# Patient Record
Sex: Male | Born: 1990 | Race: White | Hispanic: No | Marital: Single | Smoking: Current every day smoker
Health system: Southern US, Community
[De-identification: ages and names within clinical notes are randomized; demographics above are authoritative.]

## PROBLEM LIST (undated history)

## (undated) DIAGNOSIS — F32A Depression, unspecified: Secondary | ICD-10-CM

## (undated) DIAGNOSIS — F419 Anxiety disorder, unspecified: Secondary | ICD-10-CM

## (undated) DIAGNOSIS — J45909 Unspecified asthma, uncomplicated: Secondary | ICD-10-CM

## (undated) DIAGNOSIS — F329 Major depressive disorder, single episode, unspecified: Secondary | ICD-10-CM

---
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
## 2013-04-09 ENCOUNTER — Emergency Department: Payer: Self-pay | Admitting: Emergency Medicine

## 2013-08-08 IMAGING — CT CT ABD-PELV W/O CM
1 of 2 series · 15 of 32 positions shown, 19 images · non-contrast
Comparison: none

REASON FOR EXAM: (1) R FLANK PAIN,HEMATURIA; (2) R FLANK PAIN,HEMATURIA
COMMENTS:   LMP: (Male)

[Series 2: stone · axial · 0.76mm/px · z∈[-526,-76]mm · 15 of 164 slices shown, 19 images]
[im 7/164  soft-tissue]
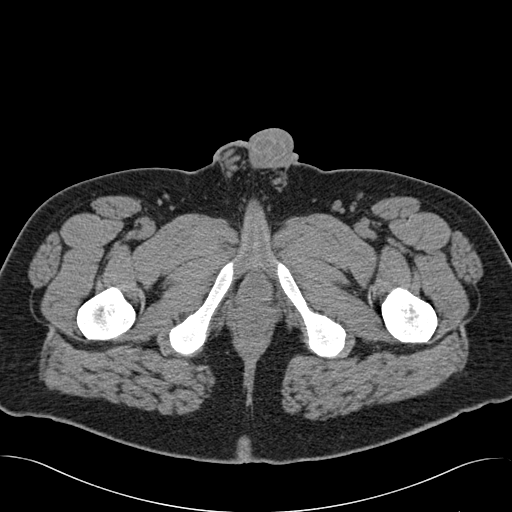
[im 7/164  bone]
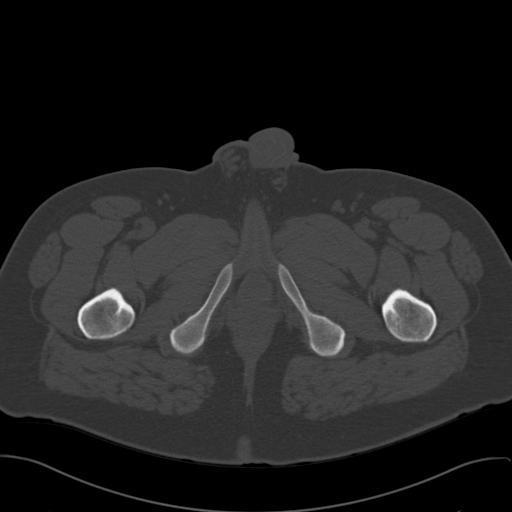
[im 19/164  soft-tissue]
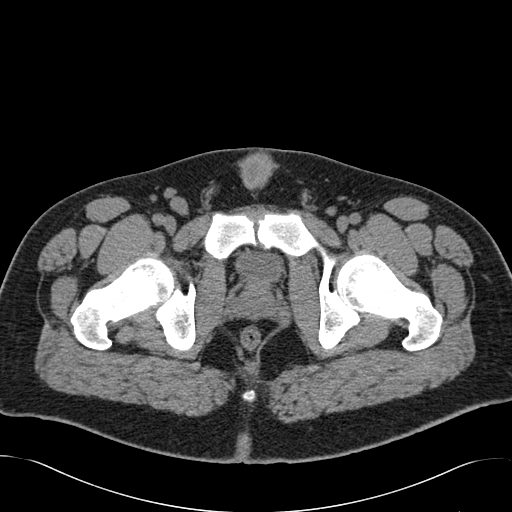
[im 32/164  soft-tissue]
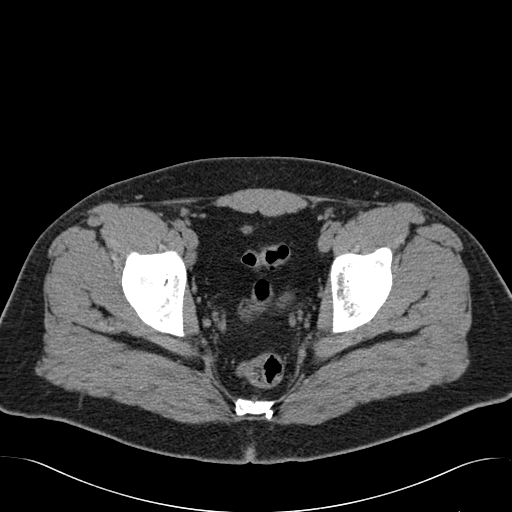
[im 44/164  soft-tissue]
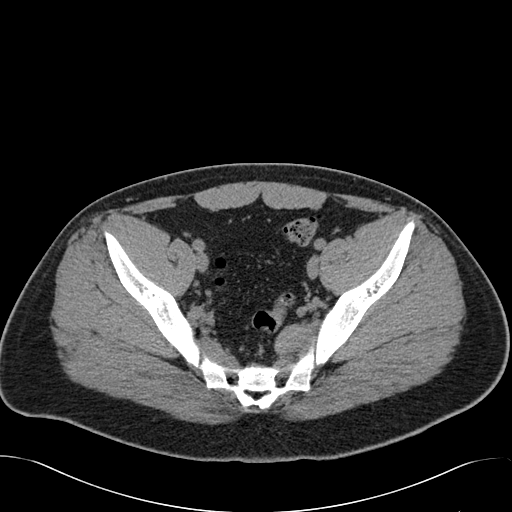
[im 57/164  soft-tissue]
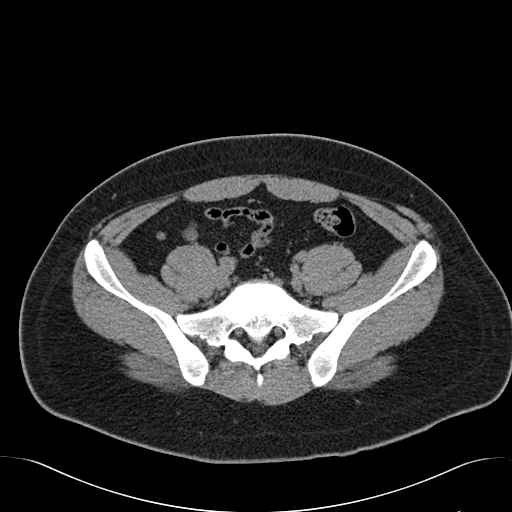
[im 69/164  soft-tissue]
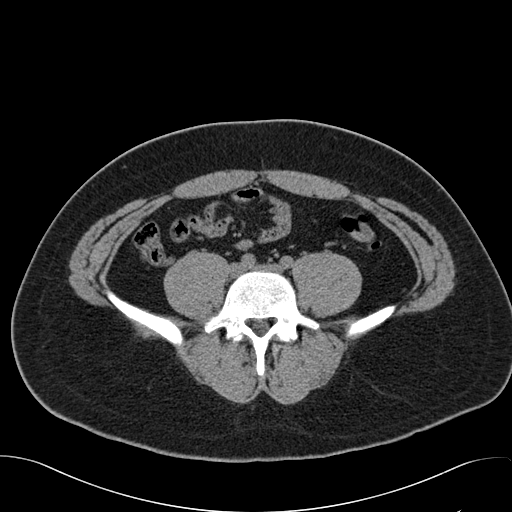
[im 82/164  soft-tissue]
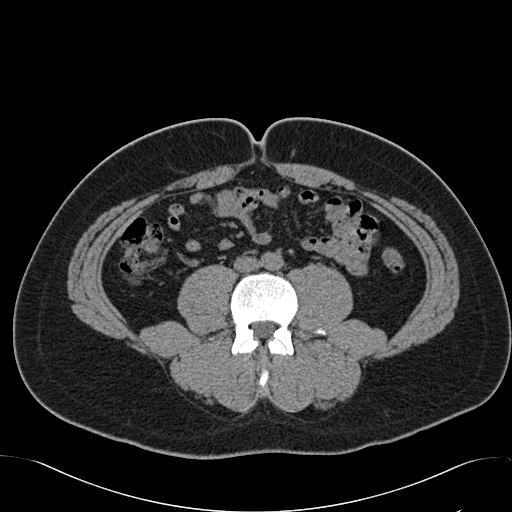
[im 95/164  soft-tissue]
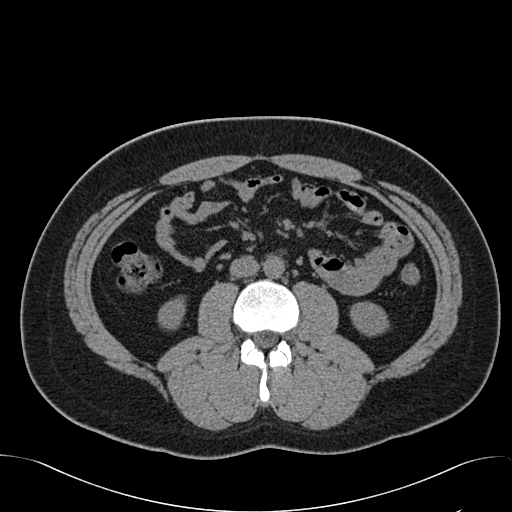
[im 107/164  soft-tissue]
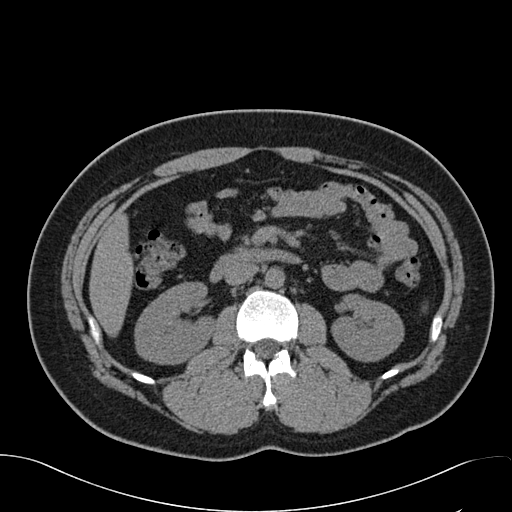
[im 107/164  bone]
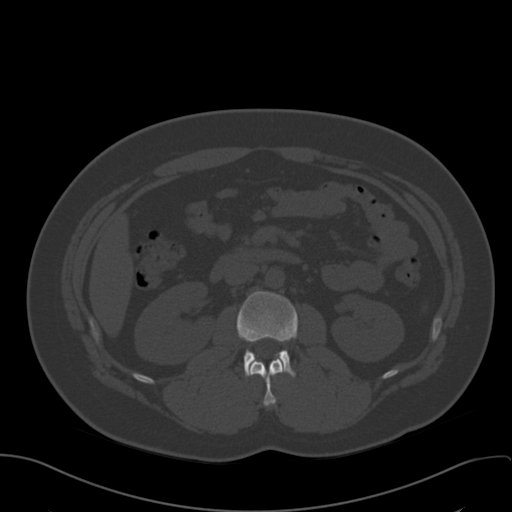
[im 120/164  soft-tissue]
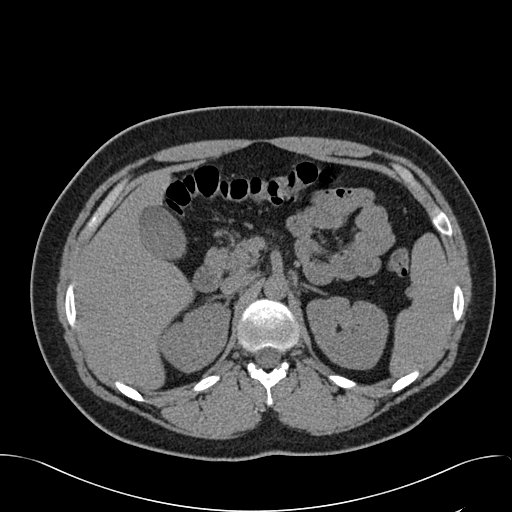
[im 132/164  soft-tissue]
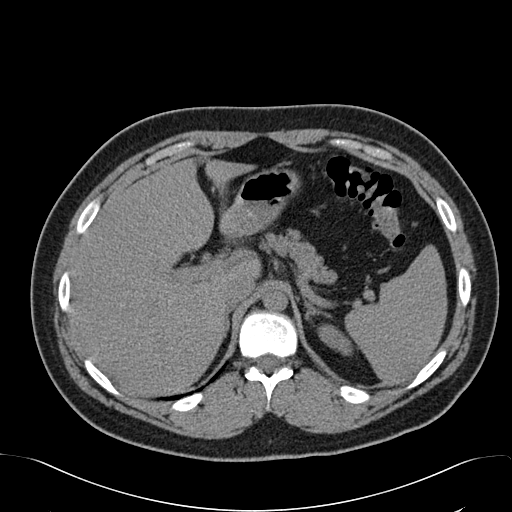
[im 138/164  lung]
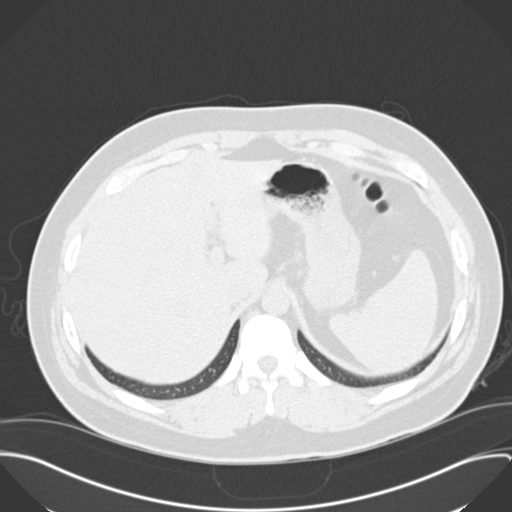
[im 145/164  soft-tissue]
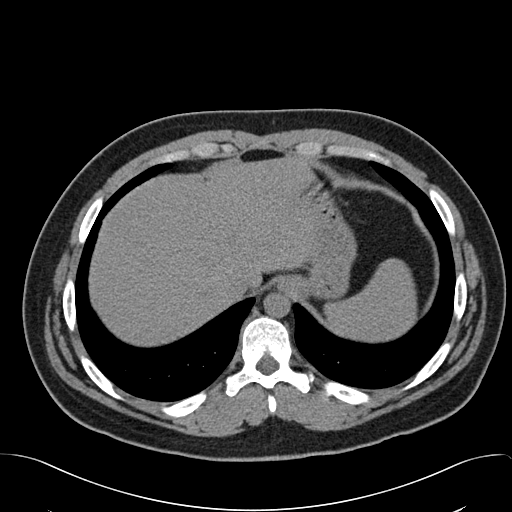
[im 145/164  lung]
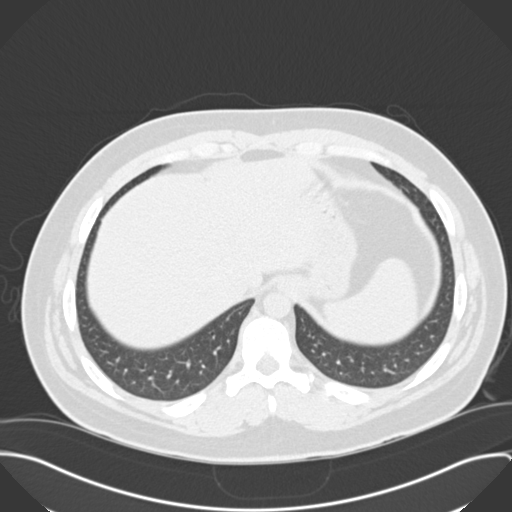
[im 151/164  lung]
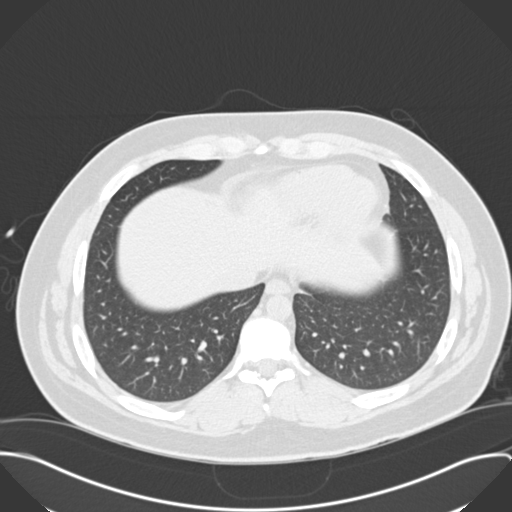
[im 157/164  soft-tissue]
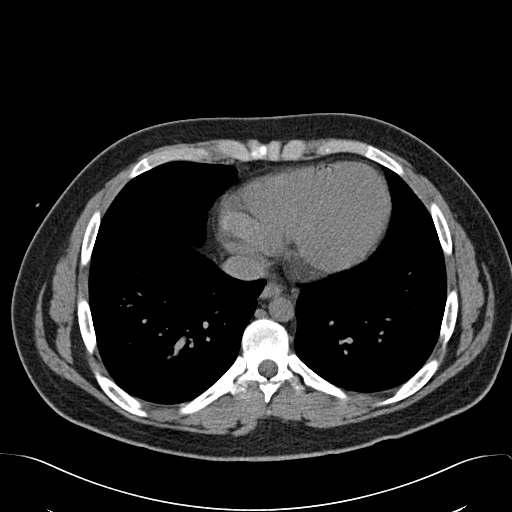
[im 157/164  lung]
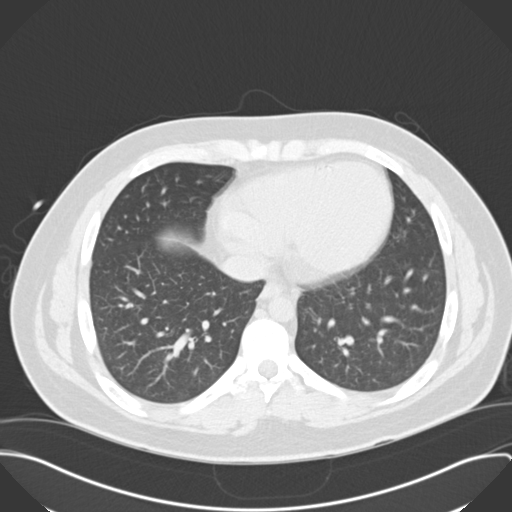

[15 of 32 positions shown; findings below may reference images not displayed]

PROCEDURE:     CT  - CT ABDOMEN AND PELVIS W[DATE]  [DATE]

RESULT:     Axial noncontrast CT scanning was performed through the abdomen
and pelvis with reconstructions at 3 mm intervals and slice thicknesses.
Review of multiplanar reconstructed images was performed separately on the
VIA monitor.

The kidneys exhibit normal contour. No calcified stones are identified and
there is no evidence of obstruction or perinephric inflammatory change.
Along the course of the ureters I see no evidence of stones or hydroureter.
The partially distended urinary bladder is normal in appearance. I see no
bladder or urethral stones.

The unopacified loops of small and large bowel exhibit no evidence of ileus
nor obstruction. A normal-appearing appendix is demonstrated on images 96
through 113. The periappendiceal soft tissues appear normal.

The liver, gallbladder, pancreas, spleen, nondistended stomach, adrenal
glands, and periaortic and pericaval regions are normal in appearance. I see
no inguinal nor umbilical hernia. The lumbar vertebral bodies are preserved
in height. The bony pelvis exhibits no acute abnormality. The psoas muscles
are normal in density and symmetric in size. The caliber of the abdominal
aorta is normal. I see no periaortic nor pericaval lymphadenopathy.
IMPRESSION: 1. I do not see evidence of urinary tract stones nor urinary tract
obstruction. No objective evidence of inflammatory changes of the urinary
tract are seen but correlation with clinical and laboratory values is needed
to exclude urinary tract infection.
2. There is no evidence of acute hepatobiliary abnormality nor acute bowel
abnormality. A normal-appearing appendix is demonstrated.
3. I do not see acute abnormality elsewhere within the abdomen or pelvis.

## 2015-09-22 IMAGING — CR LEFT WRIST - COMPLETE 3+ VIEW
1 series · 4 of 4 positions shown · non-contrast
Comparison: No priors.

CLINICAL DATA: Pain in the wrist.  Difficulty closing the hand.

EXAM:
LEFT WRIST - COMPLETE 3+ VIEW

[Series 1: pa · 0.17mm/px · 4 of 4 slices shown]
[im 1/4]
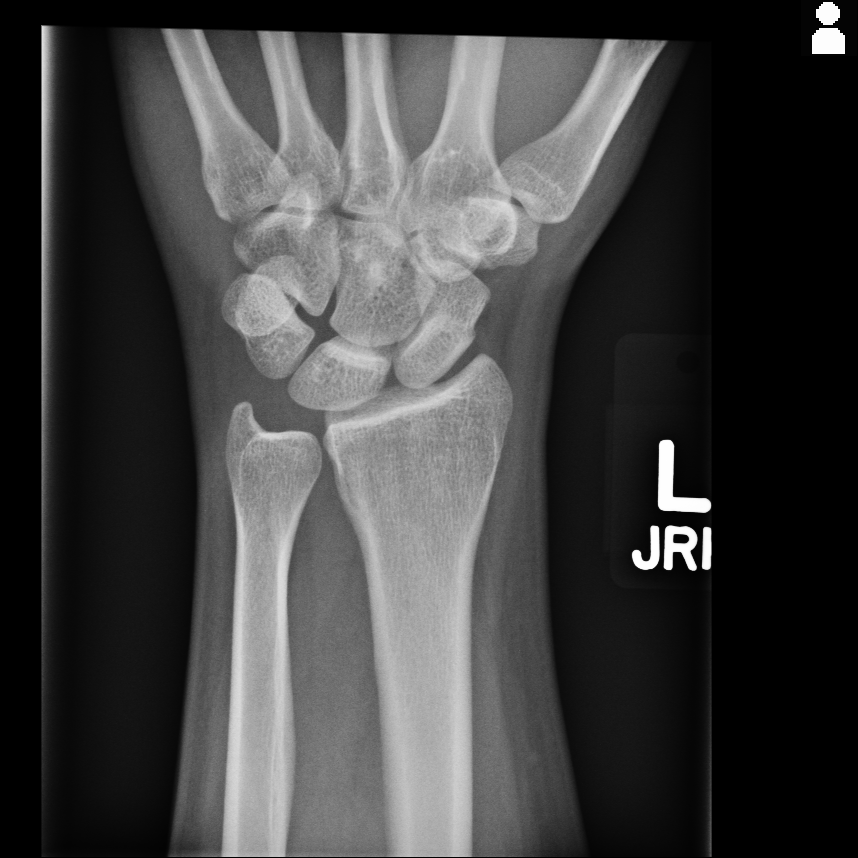
[im 2/4]
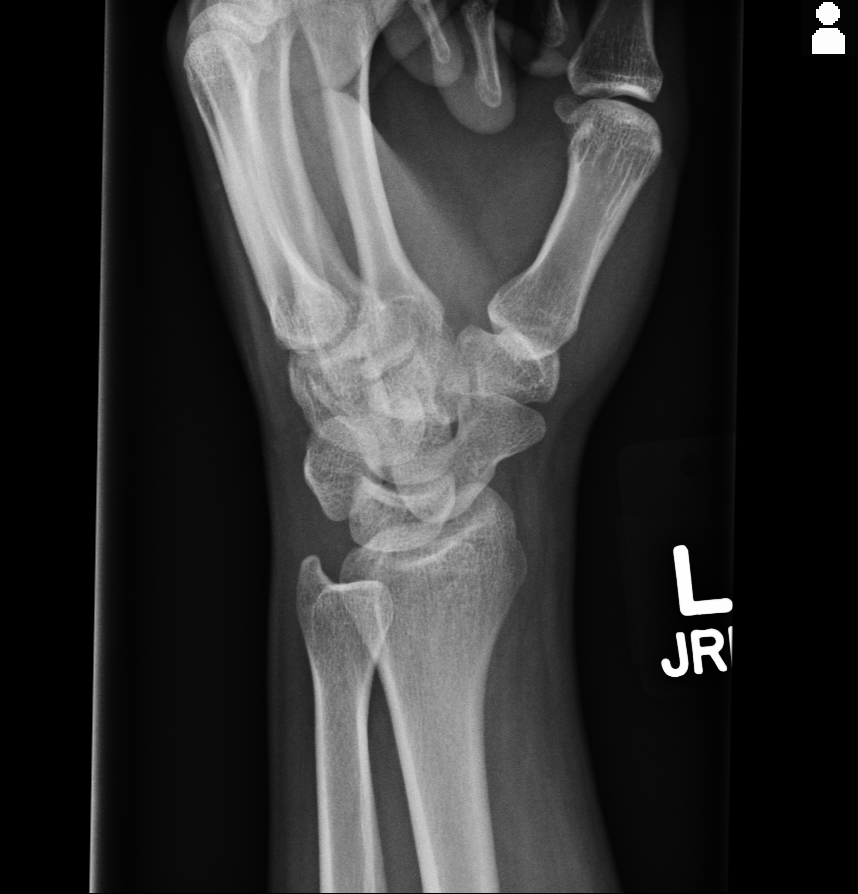
[im 3/4]
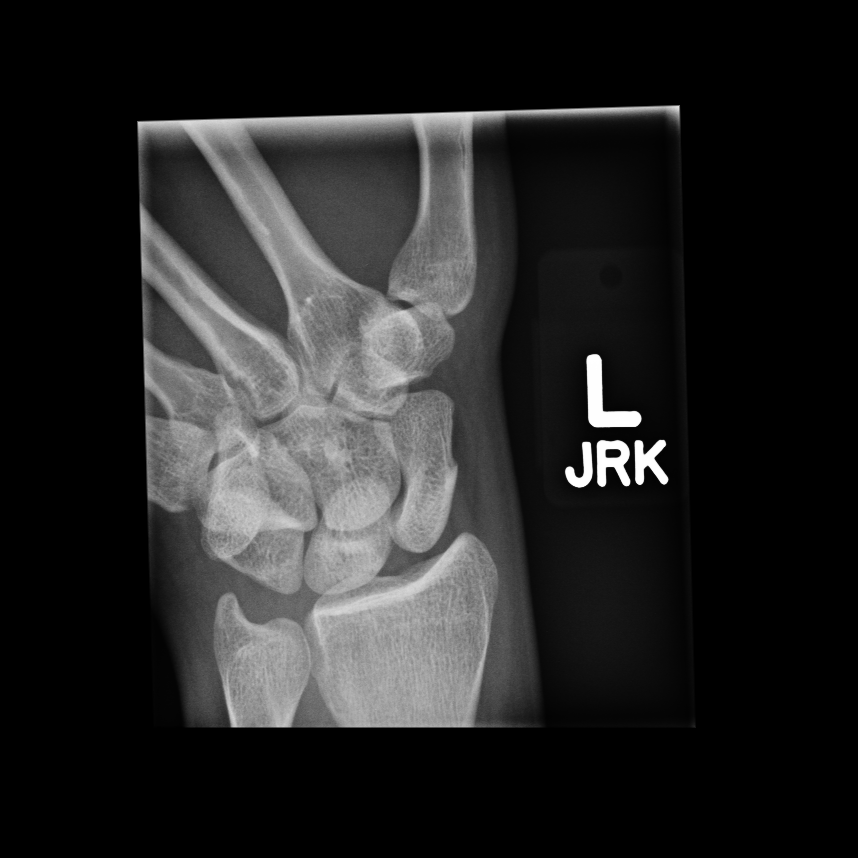
[im 4/4]
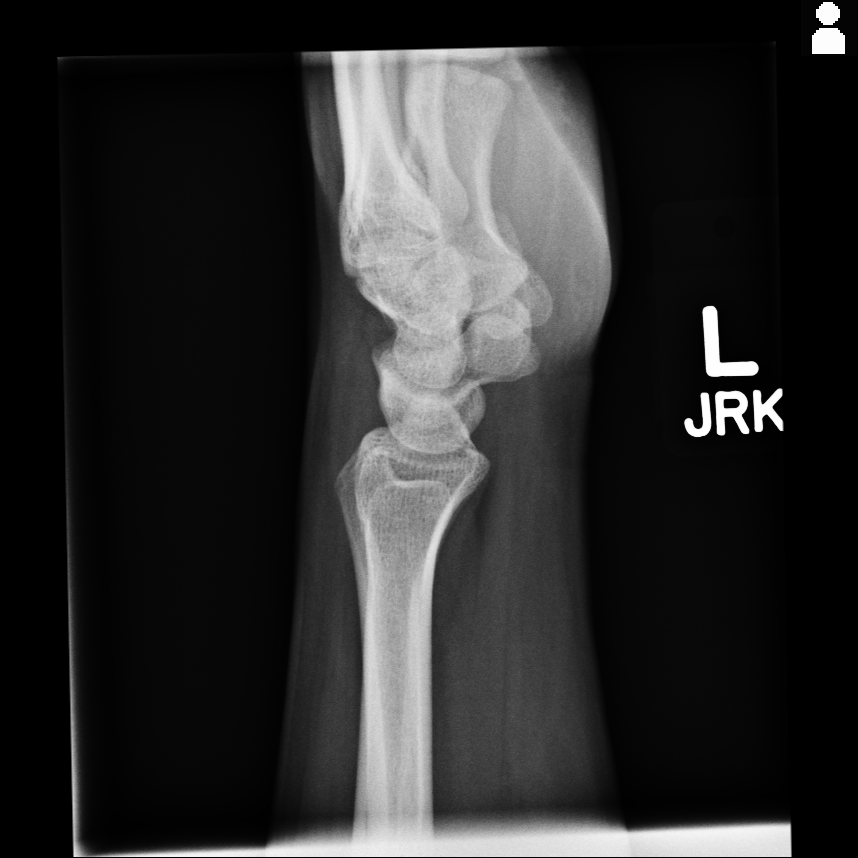

[4 of 4 positions shown; findings below may reference images not displayed]

FINDINGS: Multiple views of the left wrist demonstrate no acute displaced
fracture, subluxation, dislocation, or soft tissue abnormality.
IMPRESSION: No acute radiographic abnormality of the left wrist.

## 2016-01-17 ENCOUNTER — Emergency Department
Admission: EM | Admit: 2016-01-17 | Discharge: 2016-01-17 | Disposition: A | Discharge status: Home / Self Care | Payer: Self-pay | Attending: Emergency Medicine | Admitting: Emergency Medicine

## 2016-01-17 DIAGNOSIS — R112 Nausea with vomiting, unspecified: Secondary | ICD-10-CM | POA: Insufficient documentation

## 2016-01-17 DIAGNOSIS — R197 Diarrhea, unspecified: Secondary | ICD-10-CM | POA: Insufficient documentation

## 2016-01-17 LAB — URINALYSIS, COMPLETE (UACMP) WITH MICROSCOPIC
BACTERIA UA: NONE SEEN
BILIRUBIN URINE: NEGATIVE
Glucose, UA: NEGATIVE mg/dL
Hgb urine dipstick: NEGATIVE
KETONES UR: NEGATIVE mg/dL
LEUKOCYTES UA: NEGATIVE
Nitrite: NEGATIVE
PROTEIN: NEGATIVE mg/dL
Specific Gravity, Urine: 1.026 (ref 1.005–1.030)
WBC UA: NONE SEEN WBC/hpf (ref 0–5)
pH: 5 (ref 5.0–8.0)

## 2016-01-17 LAB — COMPREHENSIVE METABOLIC PANEL
ALT: 90 U/L — AB (ref 17–63)
AST: 49 U/L — AB (ref 15–41)
Albumin: 5.1 g/dL — ABNORMAL HIGH (ref 3.5–5.0)
Alkaline Phosphatase: 80 U/L (ref 38–126)
Anion gap: 7 (ref 5–15)
BUN: 11 mg/dL (ref 6–20)
CHLORIDE: 101 mmol/L (ref 101–111)
CO2: 30 mmol/L (ref 22–32)
Calcium: 10 mg/dL (ref 8.9–10.3)
Creatinine, Ser: 0.96 mg/dL (ref 0.61–1.24)
GFR calc non Af Amer: >60 mL/min (ref >60)
Glucose, Bld: 120 mg/dL — ABNORMAL HIGH (ref 65–99)
POTASSIUM: 4.2 mmol/L (ref 3.5–5.1)
SODIUM: 138 mmol/L (ref 135–145)
Total Bilirubin: 0.9 mg/dL (ref 0.3–1.2)
Total Protein: 8.7 g/dL — ABNORMAL HIGH (ref 6.5–8.1)

## 2016-01-17 LAB — CBC
HEMATOCRIT: 52.3 % — AB (ref 40.0–52.0)
Hemoglobin: 18.2 g/dL — ABNORMAL HIGH (ref 13.0–18.0)
MCH: 31.6 pg (ref 26.0–34.0)
MCHC: 34.8 g/dL (ref 32.0–36.0)
MCV: 90.9 fL (ref 80.0–100.0)
Platelets: 264 10*3/uL (ref 150–440)
RBC: 5.75 MIL/uL (ref 4.40–5.90)
RDW: 13.6 % (ref 11.5–14.5)
WBC: 16.2 10*3/uL — ABNORMAL HIGH (ref 3.8–10.6)

## 2016-01-17 LAB — LIPASE, BLOOD: LIPASE: 18 U/L (ref 11–51)

## 2016-01-17 MED ORDER — ONDANSETRON 4 MG PO TBDP
4.0000 mg | ORAL_TABLET | Freq: Once | ORAL | Status: AC | PRN
  Administered 2016-01-17: 4 mg via ORAL

## 2016-01-17 MED ORDER — SODIUM CHLORIDE 0.9 % IV BOLUS (SEPSIS)
1000.0000 mL | Freq: Once | INTRAVENOUS | Status: AC
  Administered 2016-01-17: 1000 mL via INTRAVENOUS

## 2016-01-17 MED ORDER — ONDANSETRON HCL 4 MG PO TABS: 4.0000 mg | ORAL_TABLET | Freq: Three times a day (TID) | ORAL | 0 refills | Status: DC | PRN

## 2016-01-17 MED ORDER — ONDANSETRON 4 MG PO TBDP
ORAL_TABLET | ORAL | Status: AC
  Filled 2016-01-17: qty 1

## 2016-01-17 MED ORDER — ONDANSETRON HCL 4 MG/2ML IJ SOLN
4.0000 mg | Freq: Once | INTRAMUSCULAR | Status: AC
  Administered 2016-01-17: 4 mg via INTRAVENOUS
  Filled 2016-01-17: qty 2

## 2016-01-17 NOTE — Discharge Instructions (Signed)
He was given IV fluids and as we discussed I suspect you have in intestinal virus.  Return to the emergency department for any worsening symptoms including bloody stool, black or bloody vomiting, dizziness or passing out, new or worsening abdominal pain, fever, altered mental status, or any other symptoms concerning to you.

## 2016-01-17 NOTE — ED Notes (Signed)
FIRST NURSE NOTE: Since 3am pt has been having vomiting and diarrhea as well as congestion and sore throat.  Unable to keep any food down.

## 2016-01-17 NOTE — ED Triage Notes (Signed)
Pt reports that he has been congested and states that last night he started with diarrhea and vomiting, last episode of vomiting was an hour ago, states no one here with him

## 2016-01-17 NOTE — ED Provider Notes (Signed)
[facility] Emergency Department Provider Note ____________________________________________   I have reviewed the triage vital signs and the triage nursing note.  HISTORY  Chief Complaint Emesis   Historian Patient  HPI Mario Coffey is a 25 y.o. male who states overnighthe's had innumerable number of watery emesis, nonbloody, and watery diarrhea. He has had some mild abdominal cramping. No fevers or chills. He is having trouble with eating getting liquids down per history.  No significant travel history or sick contacts.  Symptoms are moderate overall.    No past medical history on file.  There are no active problems to display for this patient.   No past surgical history on file.  Prior to Admission medications   Medication Sig Start Date End Date Taking? Authorizing Provider  ondansetron (ZOFRAN) 4 MG tablet Take 1 tablet (4 mg total) by mouth every 8 (eight) hours as needed for nausea or vomiting. 01/17/16   Mario Rooksebecca Carnita Golob, MD    Allergies  Allergen Reactions  . Codeine Nausea And Vomiting    No family history on file.  Social History Social History  Substance Use Topics  . Smoking status: Not on file  . Smokeless tobacco: Not on file  . Alcohol use Not on file    Review of Systems  Constitutional: Negative for fever. Eyes: Negative for visual changes. ENT: Negative for sore throat. Cardiovascular: Negative for chest pain. Respiratory: Negative for shortness of breath. Gastrointestinal:As per history of present illness.. Genitourinary: Negative for dysuria. Musculoskeletal: Negative for back pain. Skin: Negative for rash. Neurological: Negative for headache. 10 point Review of Systems otherwise negative ____________________________________________   PHYSICAL EXAM:  VITAL SIGNS: ED Triage Vitals [01/17/16 0939]  Enc Vitals Group     BP 135/80     Pulse Rate (!) 108     Resp 18     Temp 98.6 F (37 C)     Temp Source  Oral     SpO2 98 %     Weight 220 lb (99.8 kg)     Height 5\' 11"  (1.803 m)     Head Circumference      Peak Flow      Pain Score 6     Pain Loc      Pain Edu?      Excl. in GC?      Constitutional: Alert and oriented. Well appearing and in no distress. HEENT   Head: Normocephalic and atraumatic.      Eyes: Conjunctivae are normal. PERRL. Normal extraocular movements.      Ears:         Nose: No congestion/rhinnorhea.   Mouth/Throat: Mucous membranes are Moderately dry.   Neck: No stridor. Cardiovascular/Chest: Normal rate, regular rhythm.  No murmurs, rubs, or gallops. Respiratory: Normal respiratory effort without tachypnea nor retractions. Breath sounds are clear and equal bilaterally. No wheezes/rales/rhonchi. Gastrointestinal: Soft. No distention, no guarding, no rebound. Very mild tenderness diffusely without any focal abdominal pain.   Genitourinary/rectal:  Deferred Musculoskeletal: Nontender with normal range of motion in all extremities. No joint effusions.  No lower extremity tenderness.  No edema. Neurologic:  Normal speech and language. No gross or focal neurologic deficits are appreciated. Skin:  Skin is warm, dry and intact. No rash noted. Psychiatric: Mood and affect are normal. Speech and behavior are normal. Patient exhibits appropriate insight and judgment.   ____________________________________________  LABS (pertinent positives/negatives)  Labs Reviewed  COMPREHENSIVE METABOLIC PANEL - Abnormal; Notable for the following:  Result Value   Glucose, Bld 120 (*)    Total Protein 8.7 (*)    Albumin 5.1 (*)    AST 49 (*)    ALT 90 (*)    All other components within normal limits  CBC - Abnormal; Notable for the following:    WBC 16.2 (*)    Hemoglobin 18.2 (*)    HCT 52.3 (*)    All other components within normal limits  URINALYSIS, COMPLETE (UACMP) WITH MICROSCOPIC - Abnormal; Notable for the following:    Color, Urine YELLOW (*)     APPearance CLEAR (*)    Squamous Epithelial / LPF 0-5 (*)    All other components within normal limits  LIPASE, BLOOD    ____________________________________________    EKG I, Mario Rooksebecca Carmelita Amparo, MD, the attending physician have personally viewed and interpreted all ECGs.  None ____________________________________________  RADIOLOGY All Xrays were viewed by me. Imaging interpreted by Radiologist.  None __________________________________________  PROCEDURES  Procedure(s) performed: None  Critical Care performed: None  ____________________________________________   ED COURSE / ASSESSMENT AND PLAN  Pertinent labs & imaging results that were available during my care of the patient were reviewed by me and considered in my medical decision making (see chart for details).   Mr. Mario Coffey is here with nausea vomiting diarrhea overnight. He did receive Zofran in triage and has sipped on some Powerade. He does look mildly dehydrated clinically and I did offer him continue to by mouth hydrate versus IV bolus and he would like to go ahead and get one pack IV normal saline bolus.  Laboratory studies are reassuring with no acute renal failure or significant electrolyte abnormalities.   After symptomatic treatment, I think he is okay for outpatient discharge.  Again abdomen is soft and nonfocal, not highly concerned about intra-abdominal surgical/medical emergency and at this point I discussed with patient and his significant other and injured decision making on not recommending are moving forward with CT imaging at this point in time.  CONSULTATIONS:   None   Patient / Family / Caregiver informed of clinical course, medical decision-making process, and agree with plan.   I discussed return precautions, follow-up instructions, and discharge instructions with patient and/or family.   ___________________________________________   FINAL CLINICAL IMPRESSION(S) / ED DIAGNOSES   Final  diagnoses:  Nausea vomiting and diarrhea              Note: This dictation was prepared with Dragon dictation. Any transcriptional errors that result from this process are unintentional    Mario Rooksebecca Ragan Reale, MD 01/17/16 1406

## 2016-05-19 ENCOUNTER — Emergency Department
Admission: EM | Admit: 2016-05-19 | Discharge: 2016-05-19 | Disposition: A | Discharge status: Home / Self Care | Payer: Self-pay | Attending: Emergency Medicine | Admitting: Emergency Medicine

## 2016-05-19 ENCOUNTER — Encounter: Payer: Self-pay | Admitting: Emergency Medicine

## 2016-05-19 ENCOUNTER — Emergency Department: Payer: Self-pay

## 2016-05-19 DIAGNOSIS — F17210 Nicotine dependence, cigarettes, uncomplicated: Secondary | ICD-10-CM | POA: Insufficient documentation

## 2016-05-19 DIAGNOSIS — K921 Melena: Secondary | ICD-10-CM | POA: Insufficient documentation

## 2016-05-19 DIAGNOSIS — R1030 Lower abdominal pain, unspecified: Secondary | ICD-10-CM | POA: Insufficient documentation

## 2016-05-19 DIAGNOSIS — J45909 Unspecified asthma, uncomplicated: Secondary | ICD-10-CM | POA: Insufficient documentation

## 2016-05-19 HISTORY — DX: Unspecified asthma, uncomplicated: J45.909

## 2016-05-19 LAB — COMPREHENSIVE METABOLIC PANEL
ALK PHOS: 73 U/L (ref 38–126)
ALT: 49 U/L (ref 17–63)
AST: 41 U/L (ref 15–41)
Albumin: 4.5 g/dL (ref 3.5–5.0)
Anion gap: 7 (ref 5–15)
BUN: 14 mg/dL (ref 6–20)
CHLORIDE: 102 mmol/L (ref 101–111)
CO2: 29 mmol/L (ref 22–32)
CREATININE: 0.94 mg/dL (ref 0.61–1.24)
Calcium: 9.4 mg/dL (ref 8.9–10.3)
GFR calc Af Amer: >60 mL/min (ref >60)
Glucose, Bld: 106 mg/dL — ABNORMAL HIGH (ref 65–99)
Potassium: 3.9 mmol/L (ref 3.5–5.1)
SODIUM: 138 mmol/L (ref 135–145)
Total Bilirubin: 1 mg/dL (ref 0.3–1.2)
Total Protein: 7.9 g/dL (ref 6.5–8.1)

## 2016-05-19 LAB — CBC
HCT: 48.4 % (ref 40.0–52.0)
Hemoglobin: 16.9 g/dL (ref 13.0–18.0)
MCH: 31.7 pg (ref 26.0–34.0)
MCHC: 34.9 g/dL (ref 32.0–36.0)
MCV: 90.8 fL (ref 80.0–100.0)
PLATELETS: 249 10*3/uL (ref 150–440)
RBC: 5.33 MIL/uL (ref 4.40–5.90)
RDW: 12.8 % (ref 11.5–14.5)
WBC: 5.5 10*3/uL (ref 3.8–10.6)

## 2016-05-19 LAB — LIPASE, BLOOD: LIPASE: 16 U/L (ref 11–51)

## 2016-05-19 NOTE — ED Triage Notes (Signed)
States yesterday noticed blood with stool, today noted dark blood in commode. Abdominal pain x 2 days. Denies fevers.

## 2016-05-19 NOTE — ED Provider Notes (Signed)
[facility] Emergency Department Provider Note ____________________________________________   I have reviewed the triage vital signs and the triage nursing note.  HISTORY  Chief Complaint Rectal Bleeding and Abdominal Pain   Historian Patient  HPI Mario Coffey is a 26 y.o. male presenting for evaluation after one episode of bright red blood per rectum yesterday and then this morning had a loose bowel movement with some darker red per rectum. He noticed as he went to wipe himself. Sounds like overall was not that large amount. He has had some lower abdominal pain or cramping for 2 days. He thought he might be constipated. Then again today he had more of a watery bowel movement.  Nothing seems to make it worse or better. Pain is mild. Bleeding sounds like overall as mild but slightly different than his had in the past which was just the bright red blood on the toilet paper as he went.  States his dad had a history of frequent bloody bowel movements. He states his grandfather had a history of colon cancer.  He does not have a primary care doctor and currently is uninsured.      Past Medical History:  Diagnosis Date  . Asthma     There are no active problems to display for this patient.   History reviewed. No pertinent surgical history.  Prior to Admission medications   Medication Sig Start Date End Date Taking? Authorizing Provider  ondansetron (ZOFRAN) 4 MG tablet Take 1 tablet (4 mg total) by mouth every 8 (eight) hours as needed for nausea or vomiting. 01/17/16   Governor Rooks, MD    Allergies  Allergen Reactions  . Codeine Nausea And Vomiting    No family history on file.  Social History Social History  Substance Use Topics  . Smoking status: Current Every Day Smoker    Packs/day: 1.00    Types: Cigarettes  . Smokeless tobacco: Not on file  . Alcohol use Not on file    Review of Systems  Constitutional: Negative for fever. Eyes:  Negative for visual changes. ENT: Negative for sore throat. Cardiovascular: Negative for chest pain. Respiratory: Negative for shortness of breath. Gastrointestinal: Negative for Nausea or vomiting. Genitourinary: Negative for dysuria. Musculoskeletal: Negative for back pain. Skin: Negative for rash. Neurological: Negative for headache. 10 point Review of Systems otherwise negative ____________________________________________   PHYSICAL EXAM:  VITAL SIGNS: ED Triage Vitals  Enc Vitals Group     BP 05/19/16 0816 134/80     Pulse Rate 05/19/16 0816 85     Resp 05/19/16 0816 18     Temp 05/19/16 0816 98.3 F (36.8 C)     Temp Source 05/19/16 0816 Oral     SpO2 05/19/16 0816 98 %     Weight 05/19/16 0817 240 lb (108.9 kg)     Height 05/19/16 0817  (1.803 m)     Head Circumference --      Peak Flow --      Pain Score 05/19/16 0816 8     Pain Loc --      Pain Edu? --      Excl. in GC? --      Constitutional: Alert and oriented. Well appearing and in no distress. HEENT   Head: Normocephalic and atraumatic.      Eyes: Conjunctivae are normal. PERRL. Normal extraocular movements.      [facility].   Mouth/Throat: Mucous membranes are  moist.   Neck: No stridor. Cardiovascular/Chest: Normal rate, regular rhythm.  No murmurs, rubs, or gallops. Respiratory: Normal respiratory effort without tachypnea nor retractions. Breath sounds are clear and equal bilaterally. No wheezes/rales/rhonchi. Gastrointestinal: Soft. No distention, no guarding, no rebound. Very mild tenderness in the lower abdomen, no focal McBurney's point tenderness.  Genitourinary/rectal: No external hemorrhoids. No stool in the rectal vault. Hemoccult testing negative. Nontender rectal exam. Musculoskeletal: Nontender with normal range of motion in all extremities. No joint effusions.  No lower extremity tenderness.  No edema. Neurologic:  Normal speech and language.  No gross or focal neurologic deficits are appreciated. Skin:  Skin is warm, dry and intact. No rash noted. Psychiatric: Mood and affect are normal. Speech and behavior are normal. Patient exhibits appropriate insight and judgment.   ____________________________________________  LABS (pertinent positives/negatives)  Labs Reviewed  COMPREHENSIVE METABOLIC PANEL - Abnormal; Notable for the following:       Result Value   Glucose, Bld 106 (*)    All other components within normal limits  LIPASE, BLOOD  CBC  URINALYSIS, COMPLETE (UACMP) WITH MICROSCOPIC    ____________________________________________    EKG I, Governor Rooks, MD, the attending physician have personally viewed and interpreted all ECGs.  None ____________________________________________  RADIOLOGY All Xrays were viewed by me. Imaging interpreted by Radiologist.  X-ray abdomen 2 view:  FINDINGS: Upright and supine views. Normal lung bases. No pneumoperitoneum. Non obstructed bowel gas pattern. Abdominal and pelvic visceral contours are normal. No osseous abnormality identified.  IMPRESSION: Negative. Normal bowel gas pattern and no free air. __________________________________________  PROCEDURES  Procedure(s) performed: None  Critical Care performed: None  ____________________________________________   ED COURSE / ASSESSMENT AND PLAN  Pertinent labs & imaging results that were available during my care of the patient were reviewed by me and considered in my medical decision making (see chart for details).   Mr. Spofford is here for evaluation of rectal bleeding which sounds like overall mild amount, but was enough to worry him. He does not have a primary care doctor.  He states that the abdominal wall soreness or crampiness he felt was likely due to constipation. This morning he had more watery bowel movement.  No epigastric pain. Symptoms did not seem consistent with upper GI bleeding. On rectal exam  Hemoccult negative now.  No hypotension or tachycardia. Hemoglobin is 16.  On exam he has no external hemorrhoids, but we discussed the internal hemorrhoids may still be the source of the intermittent bright red blood per rectum.  Ultimately, based on his abdominal exam, I did not recommend a CT imaging at this point time. I don't think he needs an emergency GI consult or emergency colonoscopy or hospitalization.  We did discuss though however that I do think he needs close primary care follow-up for ongoing monitoring, repeat hemoglobin, and referral to gastroenterology. He is being referred to [facility] clinic as well as the [facility] for primary care. I'm giving the office number for gastroenterology, however I have also discussed with him the [facility] clinics might offer a more financially tolerable option.    CONSULTATIONS:  None   Patient / Family / Caregiver informed of clinical course, medical decision-making process, and agree with plan.   I discussed return precautions, follow-up instructions, and discharge instructions with patient and/or family.  Discharge instructions:  As we discussed, although no certain cause was found, your exam and evaluation are overall reassuring in the emergency department today.  We decided against CT of the  abdomen due to radiation risk versus benefit seems less beneficial today. This may be considered in the future.  You do need a primary care doctor to coordinate ongoing follow-up and care. I'm also recommending that you see a gastroenterologist, either here or at [facility] clinics.  Return to the emergency department immediately for any worsening condition including any new or worsening bleeding, heavy bleeding, new or worsening abdominal pain, vomiting blood, dizziness or passing out, or any other symptoms concerning to you.  ___________________________________________   FINAL CLINICAL IMPRESSION(S) / ED DIAGNOSES   Final diagnoses:  Lower  abdominal pain  Hematochezia              Note: This dictation was prepared with Dragon dictation. Any transcriptional errors that result from this process are unintentional    Governor Rooks, MD 05/19/16 [phone]

## 2016-05-19 NOTE — Discharge Instructions (Signed)
As we discussed, although no certain cause was found, your exam and evaluation are overall reassuring in the emergency department today.  We decided against CT of the abdomen due to radiation risk versus benefit seems less beneficial today. This may be considered in the future.  You do need a primary care doctor to coordinate ongoing follow-up and care. I'm also recommending that you see a gastroenterologist, either here or at [facility] clinics.  Return to the emergency department immediately for any worsening condition including any new or worsening bleeding, heavy bleeding, new or worsening abdominal pain, vomiting blood, dizziness or passing out, or any other symptoms concerning to you.

## 2017-05-10 DIAGNOSIS — Y998 Other external cause status: Secondary | ICD-10-CM | POA: Insufficient documentation

## 2017-05-10 DIAGNOSIS — Y929 Unspecified place or not applicable: Secondary | ICD-10-CM | POA: Insufficient documentation

## 2017-05-10 DIAGNOSIS — Z5321 Procedure and treatment not carried out due to patient leaving prior to being seen by health care provider: Secondary | ICD-10-CM | POA: Insufficient documentation

## 2017-05-10 DIAGNOSIS — Y9389 Activity, other specified: Secondary | ICD-10-CM | POA: Insufficient documentation

## 2017-05-10 DIAGNOSIS — S61214A Laceration without foreign body of right ring finger without damage to nail, initial encounter: Secondary | ICD-10-CM | POA: Insufficient documentation

## 2017-05-10 DIAGNOSIS — W25XXXA Contact with sharp glass, initial encounter: Secondary | ICD-10-CM | POA: Insufficient documentation

## 2017-05-11 ENCOUNTER — Emergency Department (HOSPITAL_COMMUNITY)
Admission: EM | Admit: 2017-05-11 | Discharge: 2017-05-11 | Disposition: A | Discharge status: Home / Self Care | Payer: Self-pay | Attending: Emergency Medicine | Admitting: Emergency Medicine

## 2017-05-11 ENCOUNTER — Other Ambulatory Visit: Payer: Self-pay

## 2017-05-11 ENCOUNTER — Encounter (HOSPITAL_COMMUNITY): Payer: Self-pay | Admitting: Emergency Medicine

## 2017-05-11 NOTE — ED Notes (Signed)
Pt ambulated off unit leaving after triage but before being seen by EDP

## 2017-05-11 NOTE — ED Triage Notes (Signed)
Pt states he was "playing around and hit a single glass pane window", pt has laceration to Right ring finger knuckle, incident happened about an hour ago, laceration not bleeding at this time

## 2017-05-11 NOTE — ED Provider Notes (Signed)
Pt not in room at 2:00 AM   Mario Coffey, Mario Perlstein, MD 05/11/17 [phone]

## 2017-07-26 ENCOUNTER — Encounter: Payer: Self-pay | Admitting: *Deleted

## 2017-07-26 ENCOUNTER — Other Ambulatory Visit: Payer: Self-pay

## 2017-07-26 DIAGNOSIS — K602 Anal fissure, unspecified: Secondary | ICD-10-CM | POA: Insufficient documentation

## 2017-07-26 DIAGNOSIS — F17210 Nicotine dependence, cigarettes, uncomplicated: Secondary | ICD-10-CM | POA: Insufficient documentation

## 2017-07-26 DIAGNOSIS — K625 Hemorrhage of anus and rectum: Secondary | ICD-10-CM | POA: Insufficient documentation

## 2017-07-26 NOTE — ED Triage Notes (Signed)
Pt reports blood in stools for 1 day.  Pt reports black tarry stools x 5.  States etoh use.  No dizziness.  Pt alert.  Speech clear.

## 2017-07-27 ENCOUNTER — Emergency Department
Admission: EM | Admit: 2017-07-27 | Discharge: 2017-07-27 | Disposition: A | Discharge status: Home / Self Care | Payer: Self-pay | Attending: Student in an Organized Health Care Education/Training Program | Admitting: Student in an Organized Health Care Education/Training Program

## 2017-07-27 DIAGNOSIS — K602 Anal fissure, unspecified: Secondary | ICD-10-CM

## 2017-07-27 DIAGNOSIS — K625 Hemorrhage of anus and rectum: Secondary | ICD-10-CM

## 2017-07-27 LAB — CBC
HCT: 44.9 % (ref 40.0–52.0)
Hemoglobin: 16.2 g/dL (ref 13.0–18.0)
MCH: 33.2 pg (ref 26.0–34.0)
MCHC: 36.2 g/dL — ABNORMAL HIGH (ref 32.0–36.0)
MCV: 91.7 fL (ref 80.0–100.0)
PLATELETS: 234 10*3/uL (ref 150–440)
RBC: 4.89 MIL/uL (ref 4.40–5.90)
RDW: 13.5 % (ref 11.5–14.5)
WBC: 8.2 10*3/uL (ref 3.8–10.6)

## 2017-07-27 LAB — URINALYSIS, COMPLETE (UACMP) WITH MICROSCOPIC
Bacteria, UA: NONE SEEN
Bilirubin Urine: NEGATIVE
Glucose, UA: NEGATIVE mg/dL
Hgb urine dipstick: NEGATIVE
Ketones, ur: NEGATIVE mg/dL
LEUKOCYTES UA: NEGATIVE
NITRITE: NEGATIVE
Protein, ur: NEGATIVE mg/dL
SPECIFIC GRAVITY, URINE: 1.01 (ref 1.005–1.030)
WBC, UA: NONE SEEN WBC/hpf (ref 0–5)
pH: 7 (ref 5.0–8.0)

## 2017-07-27 LAB — COMPREHENSIVE METABOLIC PANEL
ALT: 63 U/L — AB (ref 0–44)
AST: 56 U/L — ABNORMAL HIGH (ref 15–41)
Albumin: 4.3 g/dL (ref 3.5–5.0)
Alkaline Phosphatase: 75 U/L (ref 38–126)
Anion gap: 11 (ref 5–15)
BUN: 11 mg/dL (ref 6–20)
CALCIUM: 9.3 mg/dL (ref 8.9–10.3)
CHLORIDE: 101 mmol/L (ref 98–111)
CO2: 26 mmol/L (ref 22–32)
CREATININE: 0.9 mg/dL (ref 0.61–1.24)
Glucose, Bld: 113 mg/dL — ABNORMAL HIGH (ref 70–99)
Potassium: 3.7 mmol/L (ref 3.5–5.1)
Sodium: 138 mmol/L (ref 135–145)
Total Bilirubin: 0.7 mg/dL (ref 0.3–1.2)
Total Protein: 7.7 g/dL (ref 6.5–8.1)

## 2017-07-27 MED ORDER — RANITIDINE HCL 150 MG PO TABS: 150.0000 mg | ORAL_TABLET | Freq: Two times a day (BID) | ORAL | 1 refills | Status: DC

## 2017-07-27 MED ORDER — POLYETHYLENE GLYCOL 3350 17 G PO PACK: 17.0000 g | PACK | Freq: Every day | ORAL | 0 refills | Status: DC

## 2017-07-27 MED ORDER — DIBUCAINE 1 % RE OINT: 1.0000 "application " | TOPICAL_OINTMENT | RECTAL | 0 refills | Status: DC | PRN

## 2017-07-27 NOTE — ED Provider Notes (Signed)
[facility] Emergency Department Provider Note    First MD Initiated Contact with Patient 07/27/17 [id number]     (approximate)  I have reviewed the triage vital signs and the nursing notes.   HISTORY  Chief Complaint Rectal Bleeding    HPI Mario Coffey is a 27 y.o. male's the ER with chief complaint of bright red blood per rectum x1 day.  States he had one episode similar this roughly 1 year ago.  States he wiped his bottom and had a large amount of stool on the tissue paper as well as blood surrounding the stool.  States he has had issues with constipation had pain with defecation.  Denies any nausea or vomiting.  States he does have a history of heartburn and frequently drinks alcohol.  Denies any history of liver disease.  States he has noted some dark stools as well.  Is not on any antiacid medication.  Is never seen a GI doctor.    Past Medical History:  Diagnosis Date  . Asthma    No family history on file. No past surgical history on file. There are no active problems to display for this patient.     Prior to Admission medications   Medication Sig Start Date End Date Taking? Authorizing Provider  dibucaine (NUPERCAINAL) 1 % OINT Place 1 application rectally as needed for hemorrhoids. 07/27/17   Willy Eddyobinson, Ravin Bendall, MD  polyethylene glycol [facility](MIRALAX / Ethelene HalGLYCOLAX) packet Take 17 g by mouth daily. Mix one tablespoon with 8oz of your favorite juice or water every day until you are having soft formed stools. Then start taking once daily if you didn't have a stool the day before. 07/27/17   Willy Eddyobinson, Joncarlos Atkison, MD  ranitidine (ZANTAC) 150 MG tablet Take 1 tablet (150 mg total) by mouth 2 (two) times daily. 07/27/17 07/27/18  Willy Eddyobinson, Harvard Zeiss, MD    Allergies Codeine    Social History Social History   Tobacco Use  . Smoking status: Current Every Day Smoker    Packs/day: 1.00    Years: 5.00    Pack years: 5.00    Types: Cigarettes  . Smokeless tobacco:  Never Used  Substance Use Topics  . Alcohol use: Yes    Alcohol/week: 1.2 oz    Types: 2 Cans of beer per week    Comment: daily  . Drug use: Not Currently    Review of Systems Patient denies headaches, rhinorrhea, blurry vision, numbness, shortness of breath, chest pain, edema, cough, abdominal pain, nausea, vomiting, diarrhea, dysuria, fevers, rashes or hallucinations unless otherwise stated above in HPI. ____________________________________________   PHYSICAL EXAM:  VITAL SIGNS: Vitals:   07/26/17 2355  BP: (!) 132/98  Pulse: 90  Resp: 18  Temp: 98.9 F (37.2 C)  SpO2: 98%    Constitutional: Alert and oriented.  Eyes: Conjunctivae are normal.  Head: Atraumatic. Nose: No congestion/rhinnorhea. Mouth/Throat: Mucous membranes are moist.   Neck: No stridor. Painless ROM.  Cardiovascular: Normal rate, regular rhythm. Grossly normal heart sounds.  Good peripheral circulation. Respiratory: Normal respiratory effort.  No retractions. Lungs CTAB. Gastrointestinal: Soft and nontender. No distention. No abdominal bruits. No CVA tenderness. Genitourinary: Hemostatic right posterior anal fissure tear.  No evidence of abscess or hemorrhoid. Musculoskeletal: No lower extremity tenderness nor edema.  No joint effusions. Neurologic:  Normal speech and language. No gross focal neurologic deficits are appreciated. No facial droop Skin:  Skin is warm, dry and intact. No rash noted. Psychiatric: Mood and affect are normal. Speech and  behavior are normal.  ____________________________________________   LABS (all labs ordered are listed, but only abnormal results are displayed)  Results for orders placed or performed during the hospital encounter of 07/27/17 (from the past 24 hour(s))  Comprehensive metabolic panel     Status: Abnormal   Collection Time: 07/26/17 11:57 PM  Result Value Ref Range   Sodium 138 135 - 145 mmol/L   Potassium 3.7 3.5 - 5.1 mmol/L   Chloride 101 98 - 111  mmol/L   CO2 26 22 - 32 mmol/L   Glucose, Bld 113 (H) 70 - 99 mg/dL   BUN 11 6 - 20 mg/dL   Creatinine, Ser 1.61 0.61 - 1.24 mg/dL   Calcium 9.3 8.9 - 09.6 mg/dL   Total Protein 7.7 6.5 - 8.1 g/dL   Albumin 4.3 3.5 - 5.0 g/dL   AST 56 (H) 15 - 41 U/L   ALT 63 (H) 0 - 44 U/L   Alkaline Phosphatase 75 38 - 126 U/L   Total Bilirubin 0.7 0.3 - 1.2 mg/dL   GFR calc non Af Amer >60 >60 mL/min   GFR calc Af Amer >60 >60 mL/min   Anion gap 11 5 - 15  CBC     Status: Abnormal   Collection Time: 07/26/17 11:57 PM  Result Value Ref Range   WBC 8.2 3.8 - 10.6 K/uL   RBC 4.89 4.40 - 5.90 MIL/uL   Hemoglobin 16.2 13.0 - 18.0 g/dL   HCT 04.5 40.9 - 81.1 %   MCV 91.7 80.0 - 100.0 fL   MCH 33.2 26.0 - 34.0 pg   MCHC 36.2 (H) 32.0 - 36.0 g/dL   RDW 91.4 78.2 - 95.6 %   Platelets 234 150 - 440 K/uL  Urinalysis, Complete w Microscopic     Status: Abnormal   Collection Time: 07/26/17 11:57 PM  Result Value Ref Range   Color, Urine STRAW (A) YELLOW   APPearance CLEAR (A) CLEAR   Specific Gravity, Urine 1.010 1.005 - 1.030   pH 7.0 5.0 - 8.0   Glucose, UA NEGATIVE NEGATIVE mg/dL   Hgb urine dipstick NEGATIVE NEGATIVE   Bilirubin Urine NEGATIVE NEGATIVE   Ketones, ur NEGATIVE NEGATIVE mg/dL   Protein, ur NEGATIVE NEGATIVE mg/dL   Nitrite NEGATIVE NEGATIVE   Leukocytes, UA NEGATIVE NEGATIVE   RBC / HPF 0-5 0 - 5 RBC/hpf   WBC, UA NONE SEEN 0 - 5 WBC/hpf   Bacteria, UA NONE SEEN NONE SEEN   Squamous Epithelial / LPF 0-5 0 - 5   ____________________________________________ ____________________________________________  RADIOLOGY   ____________________________________________   PROCEDURES  Procedure(s) performed:  Procedures    Critical Care performed: no ____________________________________________   INITIAL IMPRESSION / ASSESSMENT AND PLAN / ED COURSE  Pertinent labs & imaging results that were available during my care of the patient were reviewed by me and considered in my  medical decision making (see chart for details).   DDX: Fissure, hemorrhoid, diverticular bleed, upper GI bleed  Mario Coffey is a 27 y.o. who presents to the ED with symptoms as described above.  Patient well-appearing and in no acute distress.  He has no evidence of acute blood loss anemia.  He is low risk by Blatchford Glascow scale.  Presentation most clinically consistent with anal fissure.  Given his history of alcohol use and heartburn will start on an acid as well as stool softeners for treatment of anal fissure.  Will give referral to GI.      As  part of my medical decision making, I reviewed the following data within the electronic MEDICAL RECORD NUMBER Nursing notes reviewed and incorporated, Labs reviewed, notes from prior ED visits.  ____________________________________________   FINAL CLINICAL IMPRESSION(S) / ED DIAGNOSES  Final diagnoses:  Anal fissure  Rectal bleeding      NEW MEDICATIONS STARTED DURING THIS VISIT:  New Prescriptions   DIBUCAINE (NUPERCAINAL) 1 % OINT    Place 1 application rectally as needed for hemorrhoids.   POLYETHYLENE GLYCOL (MIRALAX / GLYCOLAX) PACKET    Take 17 g by mouth daily. Mix one tablespoon with 8oz of your favorite juice or water every day until you are having soft formed stools. Then start taking once daily if you didn't have a stool the day before.   RANITIDINE (ZANTAC) 150 MG TABLET    Take 1 tablet (150 mg total) by mouth 2 (two) times daily.     Note:  This document was prepared using Dragon voice recognition software and may include unintentional dictation errors.    Willy Eddy, MD 07/27/17 [phone]

## 2018-01-27 ENCOUNTER — Encounter: Payer: Self-pay | Admitting: Psychiatry

## 2018-01-27 ENCOUNTER — Emergency Department
Admission: EM | Admit: 2018-01-27 | Discharge: 2018-01-28 | Disposition: A | Discharge status: Other Acute Inpatient Hospital | Payer: Self-pay | Attending: Student in an Organized Health Care Education/Training Program | Admitting: Student in an Organized Health Care Education/Training Program

## 2018-01-27 ENCOUNTER — Other Ambulatory Visit: Payer: Self-pay

## 2018-01-27 DIAGNOSIS — F17200 Nicotine dependence, unspecified, uncomplicated: Secondary | ICD-10-CM | POA: Diagnosis present

## 2018-01-27 DIAGNOSIS — J45909 Unspecified asthma, uncomplicated: Secondary | ICD-10-CM | POA: Insufficient documentation

## 2018-01-27 DIAGNOSIS — F17210 Nicotine dependence, cigarettes, uncomplicated: Secondary | ICD-10-CM | POA: Insufficient documentation

## 2018-01-27 DIAGNOSIS — F329 Major depressive disorder, single episode, unspecified: Secondary | ICD-10-CM | POA: Insufficient documentation

## 2018-01-27 DIAGNOSIS — F322 Major depressive disorder, single episode, severe without psychotic features: Secondary | ICD-10-CM | POA: Diagnosis present

## 2018-01-27 DIAGNOSIS — F1020 Alcohol dependence, uncomplicated: Secondary | ICD-10-CM | POA: Diagnosis present

## 2018-01-27 DIAGNOSIS — R45851 Suicidal ideations: Secondary | ICD-10-CM

## 2018-01-27 DIAGNOSIS — F1010 Alcohol abuse, uncomplicated: Secondary | ICD-10-CM | POA: Insufficient documentation

## 2018-01-27 DIAGNOSIS — Z79899 Other long term (current) drug therapy: Secondary | ICD-10-CM | POA: Insufficient documentation

## 2018-01-27 DIAGNOSIS — S61519A Laceration without foreign body of unspecified wrist, initial encounter: Secondary | ICD-10-CM | POA: Diagnosis present

## 2018-01-27 LAB — COMPREHENSIVE METABOLIC PANEL
ALBUMIN: 4.7 g/dL (ref 3.5–5.0)
ALT: 68 U/L — ABNORMAL HIGH (ref 0–44)
AST: 39 U/L (ref 15–41)
Alkaline Phosphatase: 77 U/L (ref 38–126)
Anion gap: 11 (ref 5–15)
BILIRUBIN TOTAL: 0.5 mg/dL (ref 0.3–1.2)
BUN: 10 mg/dL (ref 6–20)
CALCIUM: 9 mg/dL (ref 8.9–10.3)
CO2: 21 mmol/L — AB (ref 22–32)
Chloride: 107 mmol/L (ref 98–111)
Creatinine, Ser: 0.79 mg/dL (ref 0.61–1.24)
GFR calc non Af Amer: >60 mL/min (ref >60)
GLUCOSE: 115 mg/dL — AB (ref 70–99)
POTASSIUM: 3.9 mmol/L (ref 3.5–5.1)
SODIUM: 139 mmol/L (ref 135–145)
TOTAL PROTEIN: 8.4 g/dL — AB (ref 6.5–8.1)

## 2018-01-27 LAB — ACETAMINOPHEN LEVEL: Acetaminophen (Tylenol), Serum: <10 ug/mL — ABNORMAL LOW (ref 10–30)

## 2018-01-27 LAB — CBC
HCT: 48.7 % (ref 39.0–52.0)
HEMOGLOBIN: 17 g/dL (ref 13.0–17.0)
MCH: 31.8 pg (ref 26.0–34.0)
MCHC: 34.9 g/dL (ref 30.0–36.0)
MCV: 91 fL (ref 80.0–100.0)
Platelets: 270 10*3/uL (ref 150–400)
RBC: 5.35 MIL/uL (ref 4.22–5.81)
RDW: 12.5 % (ref 11.5–15.5)
WBC: 8 10*3/uL (ref 4.0–10.5)
nRBC: 0 % (ref 0.0–0.2)

## 2018-01-27 LAB — URINE DRUG SCREEN, QUALITATIVE (ARMC ONLY)
Amphetamines, Ur Screen: NOT DETECTED
BARBITURATES, UR SCREEN: NOT DETECTED
BENZODIAZEPINE, UR SCRN: NOT DETECTED
CANNABINOID 50 NG, UR ~~LOC~~: NOT DETECTED
COCAINE METABOLITE, UR ~~LOC~~: NOT DETECTED
MDMA (Ecstasy)Ur Screen: NOT DETECTED
METHADONE SCREEN, URINE: NOT DETECTED
Opiate, Ur Screen: NOT DETECTED
Phencyclidine (PCP) Ur S: NOT DETECTED
TRICYCLIC, UR SCREEN: NOT DETECTED

## 2018-01-27 LAB — SALICYLATE LEVEL

## 2018-01-27 LAB — ETHANOL: Alcohol, Ethyl (B): 201 mg/dL — ABNORMAL HIGH (ref <10)

## 2018-01-27 NOTE — BH Assessment (Signed)
Patient is to be admitted to [facility] by Dr. Jennet Maduro.  Attending Physician will be Dr. Jennet Maduro.   Patient has been assigned to room 309, by [facility] Charge Nurse T'Yawn.   ER staff is aware of the admission:  Emilie,, ER Secretary    Dr. Roxan Hockey, ER MD   Amy T., Patient's Nurse   Judi Saa, Patient Access.
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
## 2018-01-27 NOTE — ED Notes (Signed)
BEHAVIORAL HEALTH ROUNDING Patient sleeping: No. Patient alert and oriented: yes Behavior appropriate: Yes.  ; If no, describe:  Nutrition and fluids offered: yes Toileting and hygiene offered: Yes  Sitter present: q15 minute observations and security monitoring Law enforcement present: Yes  ODS   Per TTS  Pt will be admitted to [facility]  No admission orders in computer yet  - we must await MD orders and bed assignment

## 2018-01-27 NOTE — BH Assessment (Signed)
Writer was unable to complete TTS at this time. Patient kept going to sleep while talking. While try again at a latter time.
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
## 2018-01-27 NOTE — ED Notes (Signed)
BEHAVIORAL HEALTH ROUNDING Patient sleeping: Yes.   Patient alert and oriented: eyes closed  Appears asleep Behavior appropriate: Yes.  ; If no, describe:  Nutrition and fluids offered: Yes  Toileting and hygiene offered: sleeping Sitter present: q 15 minute observations and security monitoring Law enforcement present: yes  ODS 

## 2018-01-27 NOTE — ED Notes (Signed)
BEHAVIORAL HEALTH ROUNDING Patient sleeping: No. Patient alert and oriented: yes Behavior appropriate: Yes.  ; If no, describe:  Nutrition and fluids offered: yes Toileting and hygiene offered: Yes  Sitter present: q15 minute observations and security  monitoring Law enforcement present: Yes  ODS  
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
## 2018-01-27 NOTE — ED Notes (Signed)

## 2018-01-27 NOTE — Consult Note (Signed)
  Patient meets [facility] for psychiatric admission.  Orders entered. Case discussed with TTS.

## 2018-01-27 NOTE — ED Notes (Signed)
Pt reports  "I have been up drinking tonight - I usually work third shift so when I am off last night - I drank a litlle bit  - I have never been in the hospital except 2 months ago when my baby was born here "  Lacerations noted to left arm - pt states  "Yeah that was a crazy accident"

## 2018-01-28 ENCOUNTER — Inpatient Hospital Stay
Admission: AD | Admit: 2018-01-28 | Discharge: 2018-01-30 | DRG: 885 | Disposition: A | Discharge status: Home / Self Care | Payer: No Typology Code available for payment source | Attending: Psychiatry | Admitting: Psychiatry

## 2018-01-28 ENCOUNTER — Other Ambulatory Visit: Payer: Self-pay

## 2018-01-28 DIAGNOSIS — F17210 Nicotine dependence, cigarettes, uncomplicated: Secondary | ICD-10-CM | POA: Diagnosis present

## 2018-01-28 DIAGNOSIS — F17200 Nicotine dependence, unspecified, uncomplicated: Secondary | ICD-10-CM | POA: Diagnosis present

## 2018-01-28 DIAGNOSIS — G479 Sleep disorder, unspecified: Secondary | ICD-10-CM | POA: Diagnosis present

## 2018-01-28 DIAGNOSIS — Y907 Blood alcohol level of 200-239 mg/100 ml: Secondary | ICD-10-CM | POA: Diagnosis present

## 2018-01-28 DIAGNOSIS — Z885 Allergy status to narcotic agent status: Secondary | ICD-10-CM

## 2018-01-28 DIAGNOSIS — F322 Major depressive disorder, single episode, severe without psychotic features: Principal | ICD-10-CM | POA: Diagnosis present

## 2018-01-28 DIAGNOSIS — F419 Anxiety disorder, unspecified: Secondary | ICD-10-CM | POA: Diagnosis present

## 2018-01-28 DIAGNOSIS — R45851 Suicidal ideations: Secondary | ICD-10-CM | POA: Diagnosis present

## 2018-01-28 DIAGNOSIS — X789XXA Intentional self-harm by unspecified sharp object, initial encounter: Secondary | ICD-10-CM | POA: Diagnosis present

## 2018-01-28 DIAGNOSIS — F10129 Alcohol abuse with intoxication, unspecified: Secondary | ICD-10-CM | POA: Diagnosis present

## 2018-01-28 DIAGNOSIS — J45909 Unspecified asthma, uncomplicated: Secondary | ICD-10-CM | POA: Diagnosis present

## 2018-01-28 DIAGNOSIS — S61519A Laceration without foreign body of unspecified wrist, initial encounter: Secondary | ICD-10-CM | POA: Diagnosis present

## 2018-01-28 DIAGNOSIS — F1020 Alcohol dependence, uncomplicated: Secondary | ICD-10-CM | POA: Diagnosis present

## 2018-01-28 MED ORDER — ACETAMINOPHEN 325 MG PO TABS: 650.0000 mg | ORAL_TABLET | Freq: Four times a day (QID) | ORAL | Status: DC | PRN

## 2018-01-28 MED ORDER — TRAZODONE HCL 100 MG PO TABS: 150.0000 mg | ORAL_TABLET | Freq: Every evening | ORAL | Status: DC | PRN

## 2018-01-28 MED ORDER — ALUM & MAG HYDROXIDE-SIMETH 200-200-20 MG/5ML PO SUSP: 30.0000 mL | ORAL | Status: DC | PRN

## 2018-01-28 MED ORDER — NICOTINE 21 MG/24HR TD PT24
21.0000 mg | MEDICATED_PATCH | Freq: Every day | TRANSDERMAL | Status: DC
  Administered 2018-01-28 – 2018-01-30 (×3): 21 mg via TRANSDERMAL
  Filled 2018-01-28 (×3): qty 1

## 2018-01-28 MED ORDER — CHLORDIAZEPOXIDE HCL 10 MG PO CAPS
10.0000 mg | ORAL_CAPSULE | Freq: Four times a day (QID) | ORAL | Status: DC
  Administered 2018-01-28 – 2018-01-29 (×6): 10 mg via ORAL
  Filled 2018-01-28 (×6): qty 1

## 2018-01-28 MED ORDER — SERTRALINE HCL 25 MG PO TABS
25.0000 mg | ORAL_TABLET | Freq: Every day | ORAL | Status: DC
  Administered 2018-01-28 – 2018-01-29 (×2): 25 mg via ORAL
  Filled 2018-01-28 (×2): qty 1

## 2018-01-28 MED ORDER — TRAZODONE HCL 100 MG PO TABS: 100.0000 mg | ORAL_TABLET | Freq: Every evening | ORAL | Status: DC | PRN

## 2018-01-28 MED ORDER — MAGNESIUM HYDROXIDE 400 MG/5ML PO SUSP: 30.0000 mL | Freq: Every day | ORAL | Status: DC | PRN

## 2018-01-28 MED ORDER — HYDROXYZINE HCL 50 MG PO TABS: 50.0000 mg | ORAL_TABLET | Freq: Three times a day (TID) | ORAL | Status: DC | PRN
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
## 2018-01-28 NOTE — ED Notes (Signed)
Preparing patient for transfer to BMU 

## 2018-01-28 NOTE — Plan of Care (Signed)
Patient present in the milieu with steady gait. Affect flat with blunted mood. Denies having any thoughts of wanting to harm himself or anyone else at this time. States, "I feel ok, I was just drunk as hell the other night. I just want to go home." Compliant with his treatment plan at this time. Milieu remains safe at this time with q 15 minute safety checks.
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
## 2018-01-28 NOTE — Plan of Care (Signed)
Visible in the milieu, cooperative and compliant with unit expectations. Visited with family member.

## 2018-01-28 NOTE — Tx Team (Signed)
Initial Treatment Plan 01/28/2018 4:28 AM [name] KVQ:[id number]    PATIENT STRESSORS: Financial difficulties Marital or family conflict Substance abuse   PATIENT STRENGTHS: Motivation for treatment/growth Supportive family/friends   PATIENT IDENTIFIED PROBLEMS: Substance abuse     Suicide ideations                  DISCHARGE CRITERIA:  Improved stabilization in mood, thinking, and/or behavior Motivation to continue treatment in a less acute level of care  PRELIMINARY DISCHARGE PLAN: Attend 12-step recovery group Participate in family therapy  PATIENT/FAMILY INVOLVEMENT: This treatment plan has been presented to and reviewed with the patient, Mario Coffey, .  The patient and family have been given the opportunity to ask questions and make suggestions.  Trula Ore, RN 01/28/2018, 4:28 AM

## 2018-01-28 NOTE — [facility] Group Notes (Signed)
LCSW Group Therapy Note  01/28/2018 1:15pm  Type of Therapy and Topic:  Group Therapy:  Fears and Unhealthy Coping Skills  Participation Level:  Active   Description of Group:  The focus of this group was to discuss some of the prevalent fears that patients experience, and to identify the commonalities among group members.  An exercise was used to initiate the discussion, followed by writing on the white board a group-generated list of unhealthy coping and healthy coping techniques to deal with each fear.    Therapeutic Goals: 1. Patient will identify and describe 3 fears they experience 2. Patient will identify one positive coping strategy for each fear they experience 3. Patient will respond empathically to peers statements regarding fears they experience  Summary of Patient Progress:  The patient reported he feels "alright." Pt. discussed some of  his prevalent fears that he has experienced and was able to identify the commonalities among group members.  Pt shared shared with each other unhealthy copings and healthy coping techniques to deal with their fears. Pt. was actively and appropriately engaged in the group. Patient was able to provide support and validation to other group members.    Therapeutic Modalities Cognitive Behavioral Therapy Motivational Interviewing  Johnnye Sima, LCSW 01/28/2018 12:23 PM

## 2018-01-29 DIAGNOSIS — F1020 Alcohol dependence, uncomplicated: Secondary | ICD-10-CM

## 2018-01-29 DIAGNOSIS — F322 Major depressive disorder, single episode, severe without psychotic features: Principal | ICD-10-CM

## 2018-01-29 MED ORDER — CHLORDIAZEPOXIDE HCL 10 MG PO CAPS
10.0000 mg | ORAL_CAPSULE | Freq: Three times a day (TID) | ORAL | Status: DC
  Administered 2018-01-29 – 2018-01-30 (×2): 10 mg via ORAL
  Filled 2018-01-29 (×2): qty 1

## 2018-01-29 MED ORDER — SERTRALINE HCL 50 MG PO TABS: 50.0000 mg | ORAL_TABLET | Freq: Every day | ORAL | 1 refills | Status: DC

## 2018-01-29 MED ORDER — SERTRALINE HCL 25 MG PO TABS
50.0000 mg | ORAL_TABLET | Freq: Every day | ORAL | Status: DC
  Administered 2018-01-30: 50 mg via ORAL
  Filled 2018-01-29: qty 2

## 2018-01-29 MED ORDER — TRAZODONE HCL 150 MG PO TABS: 150.0000 mg | ORAL_TABLET | Freq: Every evening | ORAL | 1 refills | Status: DC | PRN

## 2018-01-29 NOTE — Plan of Care (Signed)
Patient verbalizes understanding of the general information that's been provided to him and has not voiced any further questions/concerns to this Clinical research associate. Patient denies SI/HI/AVH as well as any signs/symptoms of depression and anxiety at this time. Patient has been present in the milieu, demonstrates self-control on the unit, and has been observed interacting well with staff and other members on the unit. Patient has the ability to identify the available resources that can assist him in meeting his health care needs. Patient has been in compliance with his prescribed therapeutic regimen and has been free from any health related complications. Patient's goal for today is to "work on being able to get out of here". Patient has been free from injury thus far and remains safe on the unit at this time.  Problem: Education: Goal: Knowledge of [facility] Education information/materials will improve Outcome: Progressing Goal: Emotional status will improve Outcome: Progressing Goal: Mental status will improve Outcome: Progressing Goal: Verbalization of understanding the information provided will improve Outcome: Progressing   Problem: Activity: Goal: Interest or engagement in activities will improve Outcome: Progressing Goal: Sleeping patterns will improve Outcome: Progressing   Problem: Coping: Goal: Ability to verbalize frustrations and anger appropriately will improve Outcome: Progressing Goal: Ability to demonstrate self-control will improve Outcome: Progressing   Problem: Health Behavior/Discharge Planning: Goal: Identification of resources available to assist in meeting health care needs will improve Outcome: Progressing Goal: Compliance with treatment plan for underlying cause of condition will improve Outcome: Progressing   Problem: Physical Regulation: Goal: Ability to maintain clinical measurements within normal limits will improve Outcome: Progressing   Problem:  Safety: Goal: Periods of time without injury will increase Outcome: Progressing   Problem: Education: Goal: Ability to make informed decisions regarding treatment will improve Outcome: Progressing   Problem: Coping: Goal: Coping ability will improve Outcome: Progressing   Problem: Health Behavior/Discharge Planning: Goal: Identification of resources available to assist in meeting health care needs will improve Outcome: Progressing   Problem: Medication: Goal: Compliance with prescribed medication regimen will improve Outcome: Progressing   Problem: Self-Concept: Goal: Ability to disclose and discuss suicidal ideas will improve Outcome: Progressing Goal: Will verbalize positive feelings about self Outcome: Progressing   Problem: Education: Goal: Knowledge of disease or condition will improve Outcome: Progressing Goal: Understanding of discharge needs will improve Outcome: Progressing   Problem: Health Behavior/Discharge Planning: Goal: Ability to identify changes in lifestyle to reduce recurrence of condition will improve Outcome: Progressing Goal: Identification of resources available to assist in meeting health care needs will improve Outcome: Progressing   Problem: Physical Regulation: Goal: Complications related to the disease process, condition or treatment will be avoided or minimized Outcome: Progressing   Problem: Safety: Goal: Ability to remain free from injury will improve Outcome: Progressing

## 2018-01-29 NOTE — Progress Notes (Signed)
[facility] MD Progress Note  01/29/2018 12:07 PM Mario Coffey  MRN:  [id number] Subjective:   I'm doing fine, when can I go home?" Pt  with alcohol use issues and depression with suicide attempt while intoxicated.  Pt reports feeling better, denies SI/HI, slept 5 hrs, taking meds, tolerating well.  Principal Problem: <principal problem not specified> Diagnosis: Active Problems:   Major depressive disorder, single episode, severe without psychosis (HCC)  Total Time spent with patient: 25 min  Past Psychiatric History: no new info  Past Medical History:  Past Medical History:  Diagnosis Date  . Asthma    History reviewed. No pertinent surgical history. Family History: History reviewed. No pertinent family history. Family Psychiatric  History: no new info Social History:  Social History   Substance and Sexual Activity  Alcohol Use Yes  . Alcohol/week: 2.0 standard drinks  . Types: 2 Cans of beer per week   Comment: daily     Social History   Substance and Sexual Activity  Drug Use Not Currently    Social History   Socioeconomic History  . Marital status: Single    Spouse name: Not on file  . Number of children: Not on file  . Years of education: Not on file  . Highest education level: Not on file  Occupational History  . Not on file  Social Needs  . Financial resource strain: Not on file  . Food insecurity:    Worry: Not on file    Inability: Not on file  . Transportation needs:    Medical: Not on file    Non-medical: Not on file  Tobacco Use  . Smoking status: Current Every Day Smoker    Packs/day: 1.00    Years: 5.00    Pack years: 5.00    Types: Cigarettes  . Smokeless tobacco: Never Used  Substance and Sexual Activity  . Alcohol use: Yes    Alcohol/week: 2.0 standard drinks    Types: 2 Cans of beer per week    Comment: daily  . Drug use: Not Currently  . Sexual activity: Not on file  Lifestyle  . Physical activity:    Days per week: Not on file     Minutes per session: Not on file  . Stress: Not on file  Relationships  . Social connections:    Talks on phone: Not on file    Gets together: Not on file    Attends religious service: Not on file    Active member of club or organization: Not on file    Attends meetings of clubs or organizations: Not on file    Relationship status: Not on file  Other Topics Concern  . Not on file  Social History Narrative  . Not on file   Additional Social History:                         Sleep: Poor  Appetite:  Fair  Current Medications: Current Facility-Administered Medications  Medication Dose Route Frequency Provider Last Rate Last Dose  . acetaminophen (TYLENOL) tablet 650 mg  650 mg Oral Q6H PRN Pucilowska, Jolanta B, MD      . alum & mag hydroxide-simeth (MAALOX/MYLANTA) 200-200-20 MG/5ML suspension 30 mL  30 mL Oral Q4H PRN Pucilowska, Jolanta B, MD      . chlordiazePOXIDE (LIBRIUM) capsule 10 mg  10 mg Oral QID Pucilowska, Jolanta B, MD   10 mg at 01/29/18 1203  . hydrOXYzine (ATARAX/VISTARIL)  tablet 50 mg  50 mg Oral TID PRN Pucilowska, Jolanta B, MD      . magnesium hydroxide (MILK OF MAGNESIA) suspension 30 mL  30 mL Oral Daily PRN Pucilowska, Jolanta B, MD      . nicotine (NICODERM CQ - dosed in mg/24 hours) patch 21 mg  21 mg Transdermal Q0600 Pucilowska, Jolanta B, MD   21 mg at 01/29/18 0631  . sertraline (ZOLOFT) tablet 25 mg  25 mg Oral Daily Beverly SessionsSubedi, Mattheu Brodersen, MD   25 mg at 01/29/18 [id number]  . traZODone (DESYREL) tablet 150 mg  150 mg Oral QHS PRN Beverly SessionsSubedi, Khayree Delellis, MD        Lab Results: No results found for this or any previous visit (from the past 48 hour(s)).  Blood Alcohol level:  Lab Results  Component Value Date   ETH 201 (H) 01/27/2018    Metabolic Disorder Labs: No results found for: HGBA1C, MPG No results found for: PROLACTIN No results found for: CHOL, TRIG, HDL, CHOLHDL, VLDL, LDLCALC  Physical Findings: AIMS: Facial and Oral Movements Muscles  of Facial Expression: None, normal Lips and Perioral Area: None, normal Jaw: None, normal Tongue: None, normal,Extremity Movements Upper (arms, wrists, hands, fingers): None, normal Lower (legs, knees, ankles, toes): None, normal, Trunk Movements Neck, shoulders, hips: None, normal, Overall Severity Severity of abnormal movements (highest score from questions above): None, normal Incapacitation due to abnormal movements: None, normal Patient's awareness of abnormal movements (rate only patient's report): No Awareness, Dental Status Current problems with teeth and/or dentures?: No Does patient usually wear dentures?: No  CIWA:  CIWA-Ar Total: 2 COWS:     Musculoskeletal: Strength & Muscle Tone: within normal limits Gait & Station: normal Patient leans:   Psychiatric Specialty Exam: Physical Exam  Nursing note and vitals reviewed.   ROS  Blood pressure 116/90, pulse 76, temperature 98.1 F (36.7 C), temperature source Oral, resp. rate 16, SpO2 98 %.There is no height or weight on file to calculate BMI.  General Appearance: Disheveled  Eye Contact:  Fair  Speech:  Slow  Volume:  Normal  Mood:  Anxious and Depressed  Affect:  Congruent  Thought Process:  Coherent  Orientation:  Full (Time, Place, and Person)  Thought Content:  Depressed,   Suicidal Thoughts:  Denies   Homicidal Thoughts:  No  Memory:  Negative  Judgement:  Impaired  Insight:  Shallow  Psychomotor Activity:  Decreased  Concentration:  fair  Recall:  Fair  Fund of Knowledge:  Good  Language:  Good  Akathisia:  No  Handed:    AIMS (if indicated):     Assets:  Communication Skills Physical Health  ADL's:  Intact  Cognition:  WNL  Sleep:  Number of Hours: 5       Treatment Plan Summary: Daily contact with patient to assess and evaluate symptoms and progress in treatment and Medication management. Pt presenting with alcohol use issues and depression with suicide attempt while intoxicated.  Plan-   Tapering librium  zoloft for depression, anxiety,  trazodone for sleep TSH ordered.  Group, and milieu tx.  Will address acute medical issues when needed.  IVC status, 15min check.    Beverly SessionsJagannath Kadie Balestrieri, MD 01/29/2018, 12:07 PM

## 2018-01-29 NOTE — [facility] Suicide Risk Assessment (Signed)
[facility] Discharge Suicide Risk Assessment   Principal Problem: Major depressive disorder, single episode, severe without psychosis (HCC) Discharge Diagnoses: Principal Problem:   Major depressive disorder, single episode, severe without psychosis (HCC) Active Problems:   Alcohol use disorder, moderate, dependence (HCC)   Tobacco use disorder   Self-inflicted laceration of wrist   Total Time spent with patient: 20 minutes  Musculoskeletal: Strength & Muscle Tone: within normal limits Gait & Station: normal Patient leans: N/A  Psychiatric Specialty Exam: Review of Systems  Neurological: Negative.   Psychiatric/Behavioral: Positive for substance abuse.  All other systems reviewed and are negative.   Blood pressure 108/69, pulse 64, temperature 97.9 F (36.6 C), temperature source Oral, resp. rate 16, SpO2 97 %.There is no height or weight on file to calculate BMI.  General Appearance: Casual  Eye Contact::  Good  Speech:  Clear and Coherent409  Volume:  Normal  Mood:  Euthymic  Affect:  Appropriate  Thought Process:  Goal Directed, Irrelevant and Descriptions of Associations: Intact  Orientation:  Full (Time, Place, and Person)  Thought Content:  WDL  Suicidal Thoughts:  No  Homicidal Thoughts:  No  Memory:  Immediate;   Fair Recent;   Fair Remote;   Fair  Judgement:  Impaired  Insight:  Shallow  Psychomotor Activity:  Normal  Concentration:  Fair  Recall:  [employer] of Knowledge:Fair  Language: Fair  Akathisia:  No  Handed:  Right  AIMS (if indicated):     Assets:  Communication Skills Desire for Improvement Housing Intimacy Physical Health Resilience Social Support  Sleep:  Number of Hours: 6.5  Cognition: WNL  ADL's:  Intact   Mental Status Per Nursing Assessment::   On Admission:  NA  Demographic Factors:  Male, Caucasian and Unemployed  Loss Factors: Decrease in vocational status and Financial problems/change in socioeconomic status  Historical  Factors: Impulsivity  Risk Reduction Factors:   Responsible for children under 37 years of age, Sense of responsibility to family, Living with another person, especially a relative and Positive social support  Continued Clinical Symptoms:  Alcohol/Substance Abuse/Dependencies  Cognitive Features That Contribute To Risk:  None    Suicide Risk:  Minimal: No identifiable suicidal ideation.  Patients presenting with no risk factors but with morbid ruminations; may be classified as minimal risk based on the severity of the depressive symptoms    Plan Of Care/Follow-up recommendations:  Activity:  as tolerated Diet:  regular Other:  kep follow up appointments  Kristine Linea, MD 01/30/2018, 8:53 AM

## 2018-01-29 NOTE — Progress Notes (Signed)
Patient visited with a family member. Was calm and cooperative, denying thoughts of self harm. Stayed in the milieu with peers and attended group. No visible sign of distress. Had a snack and received bedtime medications. Stayed in the dayroom late watching TV. Went to bed and slept for the rest of the night. Currently awake. No major concern throughout this shift. Safety precautions maintained.

## 2018-01-29 NOTE — Progress Notes (Signed)
D- Patient alert and oriented. Patient presents in a peasant mood on assessment stating that he slept pretty good last night and had no major complaints or concerns to voice to this Clinical research associate. Patient denies any depression/anxiety. Patient also denies SI, HI, AVH, and pain at this time. Patient's goal for today is "to work on how to deal with triggers and to go home".  A- Scheduled medications administered to patient, per MD orders. Support and encouragement provided.  Routine safety checks conducted every 15 minutes.  Patient informed to notify staff with problems or concerns.  R- No adverse drug reactions noted. Patient contracts for safety at this time. Patient compliant with medications and treatment plan. Patient receptive, calm, and cooperative. Patient interacts well with others on the unit.  Patient remains safe at this time.

## 2018-01-29 NOTE — [facility] Group Notes (Signed)
LCSW Group Therapy Note 01/29/2018 1:15pm  Type of Therapy and Topic: Group Therapy: Feelings Around Returning Home & Establishing a Supportive Framework and Supporting Oneself When Supports Not Available  Participation Level: Active  Description of Group:  Patients first processed thoughts and feelings about upcoming discharge. These included fears of upcoming changes, lack of change, new living environments, judgements and expectations from others and overall stigma of mental health issues. The group then discussed the definition of a supportive framework, what that looks and feels like, and how do to discern it from an unhealthy non-supportive network. The group identified different types of supports as well as what to do when your family/friends are less than helpful or unavailable  Therapeutic Goals  1. Patient will identify one healthy supportive network that they can use at discharge. 2. Patient will identify one factor of a supportive framework and how to tell it from an unhealthy network. 3. Patient able to identify one coping skill to use when they do not have positive supports from others. 4. Patient will demonstrate ability to communicate their needs through discussion and/or role plays.  Summary of Patient Progress:  The patient reported he feels "pretty good today." Pt engaged during group session. As patients processed their anxiety about discharge and described healthy supports patient shared he is ready to be discharge.  Patients identified at least one self-care tool they were willing to use after discharge.   Therapeutic Modalities Cognitive Behavioral Therapy Motivational Interviewing   Geri Hepler  CUEBAS-COLON, LCSW 01/29/2018 9:58 AM

## 2018-01-29 NOTE — BHH Counselor (Signed)
Adult Comprehensive Assessment  Patient ID: Mario Coffey, male   DOB: 01-31-1990, 28 y.o.   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 / Lack of housing: stable Physical health (include injuries & life threatening diseases): none reported Social relationships: "alright" Substance abuse: ETOH Bereavement / Loss: none reported  Living/Environment/Situation:  Living Arrangements: Spouse/significant other Who else lives in the home?: girlfriend and 4 children How long has patient lived in current situation?: 1 year What is atmosphere in current home: Loving  Family History:  Marital status: Long term relationship Long term relationship, how long?: 3 years Are you sexually active?: Yes What is your sexual orientation?: heterosexual Has your sexual activity been affected by drugs, alcohol, medication, or emotional stress?: no  Does patient have children?: Yes How many children?: 4 How is patient's relationship with their children?: pt reports he has two step-children and two biological children- he states he has a great relationship with all of them  Childhood History:  By whom was/is the patient raised?: Mother Description of patient's relationship with caregiver when they were a child: "alright" Patient's description of current relationship with people who raised him/her: "we don't talk" How were you disciplined when you got in trouble as a child/adolescent?: physical punishment Does patient have siblings?: Yes Number of Siblings:  3 Description of patient's current relationship with siblings: "I don't talk to some of them" Did patient suffer any verbal/emotional/physical/sexual abuse as a child?: No Did patient suffer from severe childhood neglect?: No Has patient ever been sexually abused/assaulted/raped as an adolescent or adult?: No Was the patient ever a victim of a crime or a disaster?: No Witnessed domestic violence?: No Has patient been effected by domestic violence as an adult?: No  Education:  Highest grade of school patient has completed: GED Currently a Consulting civil engineer?: No Learning disability?: No  Employment/Work Situation:   Employment situation: Unemployed Patient's job has been impacted by current illness: No What is the longest time patient has a held a job?: 3 MONTHS 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, environmental Did You Receive Any Psychiatric Treatment/Services While in Equities trader?: No Are There Guns or Other Weapons in Your Home?: No  Financial Resources:   Financial resources: Income from spouse Does patient have a representative payee or guardian?: No  Alcohol/Substance Abuse:   What has been your use of drugs/alcohol within the last 12 months?: ETOH -daily If attempted suicide, did drugs/alcohol play a role in this?: Yes Alcohol/Substance Abuse Treatment Hx: Denies past history Has alcohol/substance abuse ever caused legal problems?: Yes(DUI)  Social Support System:   Patient's Community Support System: [employer] Museum/gallery exhibitions officer System: girlfriend and children Type of faith/religion: n/a How does patient's faith help to cope with current illness?: n/a  Leisure/Recreation:   Leisure and Hobbies: cooking and reading  Strengths/Needs:   What is the patient's perception of their strengths?: "good at being a father" Patient states they can use these personal strengths during their treatment to contribute to their recovery: "I don't know" Patient states these  barriers may affect/interfere with their treatment: "I don't know" Patient states these barriers may affect their  return to the community: "might go to jail after discharge"  Discharge Plan:   Currently receiving community mental health services: No Patient states concerns and preferences for aftercare planning are: TBD with CSW Patient states they will know when they are safe and ready for discharge when: "I am ready now" Does patient have access to transportation?: Yes(girlfriend) Does patient have financial barriers related to discharge medications?: No Will patient be returning to same living situation after discharge?: Yes  Summary/Recommendations:   Summary and Recommendations (to be completed by the evaluator): Patient is a 28 year old male admitted voluntarily and diagnosed with Major depressive disorder, single episode, severe without psychosis. Pt with alcohol use issues and depression with suicide attempt while intoxicated. Patient will benefit from crisis stabilization, medication evaluation, group therapy and psychoeducation. In addition to case management for discharge planning. At discharge it is recommended that patient adhere to the established discharge plan and continue treatment.   Irina Okelly  CUEBAS-COLON. 01/29/2018

## 2018-01-30 LAB — TSH: TSH: 3.134 u[IU]/mL (ref 0.350–4.500)

## 2018-01-30 MED ORDER — SERTRALINE HCL 100 MG PO TABS: 100.0000 mg | ORAL_TABLET | Freq: Every day | ORAL | Status: DC

## 2018-01-30 MED ORDER — SERTRALINE HCL 100 MG PO TABS: 100.0000 mg | ORAL_TABLET | Freq: Every day | ORAL | 1 refills | Status: DC

## 2018-01-30 NOTE — B[facility] Suicide Risk Assessment (Signed)
[facility] INPATIENT:  Family/Significant Other Suicide Prevention Education  Suicide Prevention Education:  Education Completed; Mario Coffey [phone] has been identified by the patient as the family member/significant other with whom the patient will be residing, and identified as the person(s) who will aid the patient in the event of a mental health crisis (suicidal ideations/suicide attempt).  With written consent from the patient, the family member/significant other has been provided the following suicide prevention education, prior to the and/or following the discharge of the patient.  The suicide prevention education provided includes the following:  Suicide risk factors  Suicide prevention and interventions  National Suicide Hotline telephone number  [facility] assessment telephone number  [facility] 911  [facility] and/or [facility]idential [facility] telephone number  Request made of family/significant other to:  Remove weapons (e.g., guns, rifles, knives), all items previously/currently identified as safety concern.    Remove drugs/medications (over-the-counter, prescriptions, illicit drugs), all items previously/currently identified as a safety concern.  The family member/significant other verbalizes understanding of the suicide prevention education information provided.  The family member/significant other agrees to remove the items of safety concern listed above. Ms. Despina HiddenShiflet denies pt having access to guns or weapons in the home. CSW informed Ms. Coffey if pt needs further medical attention to call 911 or bring him to the nearest emergency department. She denies having any reservation about pt returning to the home.  Wania Longstreth T Illyana Schorsch 01/30/2018, 10:56 AM

## 2018-01-30 NOTE — Tx Team (Signed)
Interdisciplinary Treatment and Diagnostic Plan Update  01/30/2018 Time of Session: 1030am Mario Coffey MRN: [id number]  Principal Diagnosis: Major depressive disorder, single episode, severe without psychosis (HCC)  Secondary Diagnoses: Principal Problem:   Major depressive disorder, single episode, severe without psychosis (HCC) Active Problems:   Alcohol use disorder, moderate, dependence (HCC)   Tobacco use disorder   Self-inflicted laceration of wrist   Current Medications:  Current Facility-Administered Medications  Medication Dose Route Frequency Provider Last Rate Last Dose  . acetaminophen (TYLENOL) tablet 650 mg  650 mg Oral Q6H PRN Pucilowska, Jolanta B, MD      . alum & mag hydroxide-simeth (MAALOX/MYLANTA) 200-200-20 MG/5ML suspension 30 mL  30 mL Oral Q4H PRN Pucilowska, Jolanta B, MD      . chlordiazePOXIDE (LIBRIUM) capsule 10 mg  10 mg Oral TID Beverly Sessions, MD   10 mg at 01/30/18 0803  . hydrOXYzine (ATARAX/VISTARIL) tablet 50 mg  50 mg Oral TID PRN Pucilowska, Jolanta B, MD      . magnesium hydroxide (MILK OF MAGNESIA) suspension 30 mL  30 mL Oral Daily PRN Pucilowska, Jolanta B, MD      . nicotine (NICODERM CQ - dosed in mg/24 hours) patch 21 mg  21 mg Transdermal Q0600 Pucilowska, Jolanta B, MD   21 mg at 01/30/18 7829  . [START ON 01/31/2018] sertraline (ZOLOFT) tablet 100 mg  100 mg Oral Daily Pucilowska, Jolanta B, MD      . traZODone (DESYREL) tablet 150 mg  150 mg Oral QHS PRN Beverly Sessions, MD       PTA Medications: Medications Prior to Admission  Medication Sig Dispense Refill Last Dose  . dibucaine (NUPERCAINAL) 1 % OINT Place 1 application rectally as needed for hemorrhoids. 28 g 0   . polyethylene glycol (MIRALAX / GLYCOLAX) packet Take 17 g by mouth daily. Mix one tablespoon with 8oz of your favorite juice or water every day until you are having soft formed stools. Then start taking once daily if you didn't have a stool the day before. 30  each 0   . ranitidine (ZANTAC) 150 MG tablet Take 1 tablet (150 mg total) by mouth 2 (two) times daily. 60 tablet 1     Patient Stressors: Financial difficulties Marital or family conflict Substance abuse  Patient Strengths: Motivation for treatment/growth Supportive family/friends  Treatment Modalities: Medication Management, Group therapy, Case management,  1 to 1 session with clinician, Psychoeducation, Recreational therapy.   Physician Treatment Plan for Primary Diagnosis: Major depressive disorder, single episode, severe without psychosis (HCC) Long Term Goal(s): Improvement in symptoms so as ready for discharge Improvement in symptoms so as ready for discharge   Short Term Goals: Ability to identify changes in lifestyle to reduce recurrence of condition will improve Ability to verbalize feelings will improve Ability to disclose and discuss suicidal ideas Ability to demonstrate self-control will improve Ability to identify and develop effective coping behaviors will improve Ability to maintain clinical measurements within normal limits will improve Compliance with prescribed medications will improve Ability to identify triggers associated with substance abuse/mental health issues will improve Ability to identify changes in lifestyle to reduce recurrence of condition will improve Ability to verbalize feelings will improve Ability to disclose and discuss suicidal ideas Ability to demonstrate self-control will improve Ability to identify and develop effective coping behaviors will improve Ability to maintain clinical measurements within normal limits will improve Compliance with prescribed medications will improve Ability to identify triggers associated with substance abuse/mental health issues will  improve  Medication Management: Evaluate patient's response, side effects, and tolerance of medication regimen.  Therapeutic Interventions: 1 to 1 sessions, Unit Group sessions and  Medication administration.  Evaluation of Outcomes: Progressing  Physician Treatment Plan for Secondary Diagnosis: Principal Problem:   Major depressive disorder, single episode, severe without psychosis (HCC) Active Problems:   Alcohol use disorder, moderate, dependence (HCC)   Tobacco use disorder   Self-inflicted laceration of wrist  Long Term Goal(s): Improvement in symptoms so as ready for discharge Improvement in symptoms so as ready for discharge   Short Term Goals: Ability to identify changes in lifestyle to reduce recurrence of condition will improve Ability to verbalize feelings will improve Ability to disclose and discuss suicidal ideas Ability to demonstrate self-control will improve Ability to identify and develop effective coping behaviors will improve Ability to maintain clinical measurements within normal limits will improve Compliance with prescribed medications will improve Ability to identify triggers associated with substance abuse/mental health issues will improve Ability to identify changes in lifestyle to reduce recurrence of condition will improve Ability to verbalize feelings will improve Ability to disclose and discuss suicidal ideas Ability to demonstrate self-control will improve Ability to identify and develop effective coping behaviors will improve Ability to maintain clinical measurements within normal limits will improve Compliance with prescribed medications will improve Ability to identify triggers associated with substance abuse/mental health issues will improve     Medication Management: Evaluate patient's response, side effects, and tolerance of medication regimen.  Therapeutic Interventions: 1 to 1 sessions, Unit Group sessions and Medication administration.  Evaluation of Outcomes: Progressing   RN Treatment Plan for Primary Diagnosis: Major depressive disorder, single episode, severe without psychosis (HCC) Long Term Goal(s): Knowledge of  disease and therapeutic regimen to maintain health will improve  Short Term Goals: Ability to verbalize feelings will improve, Ability to disclose and discuss suicidal ideas, Ability to identify and develop effective coping behaviors will improve and Compliance with prescribed medications will improve  Medication Management: RN will administer medications as ordered by provider, will assess and evaluate patient's response and provide education to patient for prescribed medication. RN will report any adverse and/or side effects to prescribing provider.  Therapeutic Interventions: 1 on 1 counseling sessions, Psychoeducation, Medication administration, Evaluate responses to treatment, Monitor vital signs and CBGs as ordered, Perform/monitor CIWA, COWS, AIMS and Fall Risk screenings as ordered, Perform wound care treatments as ordered.  Evaluation of Outcomes: Progressing   LCSW Treatment Plan for Primary Diagnosis: Major depressive disorder, single episode, severe without psychosis (HCC) Long Term Goal(s): Safe transition to appropriate next level of care at discharge, Engage patient in therapeutic group addressing interpersonal concerns.  Short Term Goals: Engage patient in aftercare planning with referrals and resources  Therapeutic Interventions: Assess for all discharge needs, 1 to 1 time with Social worker, Explore available resources and support systems, Assess for adequacy in community support network, Educate family and significant other(s) on suicide prevention, Complete Psychosocial Assessment, Interpersonal group therapy.  Evaluation of Outcomes: Progressing   Progress in Treatment: Attending groups: Yes. Participating in groups: Yes. Taking medication as prescribed: Yes. Toleration medication: Yes. Family/Significant other contact made: Yes, individual(s) contacted:  Jon Gills Shiflet Patient understands diagnosis: Yes. Discussing patient identified problems/goals with staff:  Yes. Medical problems stabilized or resolved: Yes. Denies suicidal/homicidal ideation: Yes. Issues/concerns per patient self-inventory: No. Other: NA  New problem(s) identified: No, Describe:  None reported  New Short Term/Long Term Goal(s):"Be able to stop drinking"  Patient Goals:  "Be able  to stop drinking"  Discharge Plan or Barriers: Pt will return home and [facility]  Reason for Continuation of Hospitalization: Medication stabilization  Estimated Length of Stay:Discharged on 01/30/18  Attendees: Patient:Mario Coffey 01/30/2018 11:03 AM  Physician: Kristine LineaJolanta Pucilowska MD 01/30/2018 11:03 AM  Nursing: Hulan AmatoGwen Farrish RN 01/30/2018 11:03 AM  RN Care Manager: 01/30/2018 11:03 AM  Social Worker: Lowella Dandyarren Shameria Trimarco LCSW Penni HomansMichaela Stanfield LCSW 01/30/2018 11:03 AM  Recreational Therapist: Garret ReddishShay Outlaw LRT 01/30/2018 11:03 AM  Other:  01/30/2018 11:03 AM  Other:  01/30/2018 11:03 AM  Other: 01/30/2018 11:03 AM    Scribe for Treatment Team: Suzan SlickARREN T Abram Sax, LCSW 01/30/2018 11:03 AM

## 2018-01-30 NOTE — Progress Notes (Addendum)
Nursing discharge note: Patient discharged home per MD order.  Patient received all personal belongings from unit and locker.  Reviewed AVS/transition record with patient and he indicates understanding.  Patient will follow up with [facility].  Patient denies any thoughts of self harm.  He left ambulatory with a friend.  Patient received prescriptions and a 7-day supply of his medications.
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
## 2018-01-30 NOTE — Progress Notes (Signed)
  [facility] Discharge [facility] :  Will you be returning to the same living situation after discharge:  Yes,  lives with girlfriend At discharge, do you have transportation home?: Yes,  girlfriend will pick up at discharge Do you have the ability to pay for your medications: Yes,  referred to provider who can assist with medication  Release of information consent forms completed and in the chart;  Patient's signature needed at discharge.  Patient to Follow up at: Follow-up Information    [facility] Go on 02/03/2018.   Why:  [facility] at [facility] on Friday, February 03, 2018 at 7:15am; Lorella NimrodHarvey will meet you there. Please bring photo ID, insurance card, medication. Thank you. Contact information: [address] [place] [id number] [phone]           Next level of care provider [facility] Link:no  Safety Planning and Suicide Prevention discussed: Yes,  Armond HangAlexis Shiflet, girlfriend  Have you used any form of tobacco in the last 30 days? (Cigarettes, Smokeless Tobacco, Cigars, and/or Pipes): Yes  Has patient been referred to the Quitline? Physician counseled pt on smoking cessation  Patient has been referred for addiction treatment: N/A  Suzan SlickDARREN T Lalena Salas, LCSW 01/30/2018, 11:00 AM

## 2018-01-30 NOTE — Progress Notes (Signed)
Recreation Therapy Notes  Date: 01/30/2018  Time: 9:30 am  Location: Craft Room  Behavioral response: Appropriate  Intervention Topic: Emotions  Discussion/Intervention:  Group content on today was focused on emotions. The group identified what emotions are and why it is important to have emotions. Patients expressed some positive and negative emotions. Individuals gave some past experiences on how they normally dealt with emotions in the past. The group described some positive ways to deal with emotions in the future. Patients participated in the intervention "Name the [name]" where individuals were given a chance to experience different emotions.  Clinical Observations/Feedback:  Patient came to group late due to unknown reasons. Individual was social with peers and staff while participating in the intervention. Kaytlynne Neace LRT/CTRS         [name] 01/30/2018 10:54 AM

## 2018-01-30 NOTE — Plan of Care (Signed)
Stayed in the dayroom with peers until bedtime. Cooperative and calm. Attended group, had medications and snack and no sign of distress.

## 2018-01-30 NOTE — Plan of Care (Signed)
D: Patient states, "I slept better last night than I have the whole time I've been here."  Patient observed resting in bed.  He came up for meds and states, "I never really had any withdrawal symptoms.  I'm ready for discharge. I have 4 kids at home."  Patient denies any thoughts of self harm. He is pleasant and focused on discharge.  He is scheduled for discharge today.  A: Continue to monitor medication management and MD orders.  Safety checks completed every 15 minutes per protocol.  Offer support and encouragement as needed.  R: Patient is receptive to staff; his behavior is appropriate.     Problem: Education: Goal: Knowledge of [place] [facility] Education information/materials will improve Outcome: Completed/Met Goal: Emotional status will improve Outcome: Completed/Met Goal: Mental status will improve Outcome: Completed/Met Goal: Verbalization of understanding the information provided will improve Outcome: Completed/Met   Problem: Activity: Goal: Interest or engagement in activities will improve Outcome: Completed/Met Goal: Sleeping patterns will improve Outcome: Completed/Met   Problem: Coping: Goal: Ability to verbalize frustrations and anger appropriately will improve Outcome: Completed/Met Goal: Ability to demonstrate self-control will improve Outcome: Completed/Met   Problem: Health Behavior/Discharge Planning: Goal: Identification of resources available to assist in meeting health care needs will improve Outcome: Completed/Met Goal: Compliance with treatment plan for underlying cause of condition will improve Outcome: Completed/Met   Problem: Physical Regulation: Goal: Ability to maintain clinical measurements within normal limits will improve Outcome: Completed/Met   Problem: Safety: Goal: Periods of time without injury will increase Outcome: Completed/Met   Problem: Education: Goal: Ability to make informed decisions regarding treatment will  improve Outcome: Completed/Met   Problem: Coping: Goal: Coping ability will improve Outcome: Completed/Met   Problem: Health Behavior/Discharge Planning: Goal: Identification of resources available to assist in meeting health care needs will improve Outcome: Completed/Met   Problem: Medication: Goal: Compliance with prescribed medication regimen will improve Outcome: Completed/Met   Problem: Self-Concept: Goal: Ability to disclose and discuss suicidal ideas will improve Outcome: Completed/Met Goal: Will verbalize positive feelings about self Outcome: Completed/Met   Problem: Education: Goal: Knowledge of disease or condition will improve Outcome: Completed/Met Goal: Understanding of discharge needs will improve Outcome: Completed/Met   Problem: Health Behavior/Discharge Planning: Goal: Ability to identify changes in lifestyle to reduce recurrence of condition will improve Outcome: Completed/Met Goal: Identification of resources available to assist in meeting health care needs will improve Outcome: Completed/Met   Problem: Physical Regulation: Goal: Complications related to the disease process, condition or treatment will be avoided or minimized Outcome: Completed/Met   Problem: Safety: Goal: Ability to remain free from injury will improve Outcome: Completed/Met

## 2018-08-21 ENCOUNTER — Emergency Department
Admission: EM | Admit: 2018-08-21 | Discharge: 2018-08-22 | Disposition: A | Discharge status: Other Acute Inpatient Hospital | Payer: Self-pay | Attending: Emergency Medicine | Admitting: Emergency Medicine

## 2018-08-21 DIAGNOSIS — S61519A Laceration without foreign body of unspecified wrist, initial encounter: Secondary | ICD-10-CM | POA: Diagnosis present

## 2018-08-21 DIAGNOSIS — Y939 Activity, unspecified: Secondary | ICD-10-CM | POA: Insufficient documentation

## 2018-08-21 DIAGNOSIS — Y999 Unspecified external cause status: Secondary | ICD-10-CM | POA: Insufficient documentation

## 2018-08-21 DIAGNOSIS — F1020 Alcohol dependence, uncomplicated: Secondary | ICD-10-CM | POA: Diagnosis present

## 2018-08-21 DIAGNOSIS — R4689 Other symptoms and signs involving appearance and behavior: Secondary | ICD-10-CM | POA: Diagnosis present

## 2018-08-21 DIAGNOSIS — X788XXA Intentional self-harm by other sharp object, initial encounter: Secondary | ICD-10-CM | POA: Insufficient documentation

## 2018-08-21 DIAGNOSIS — J45909 Unspecified asthma, uncomplicated: Secondary | ICD-10-CM | POA: Insufficient documentation

## 2018-08-21 DIAGNOSIS — S41112A Laceration without foreign body of left upper arm, initial encounter: Secondary | ICD-10-CM | POA: Insufficient documentation

## 2018-08-21 DIAGNOSIS — Z20828 Contact with and (suspected) exposure to other viral communicable diseases: Secondary | ICD-10-CM | POA: Insufficient documentation

## 2018-08-21 DIAGNOSIS — F17200 Nicotine dependence, unspecified, uncomplicated: Secondary | ICD-10-CM | POA: Diagnosis present

## 2018-08-21 DIAGNOSIS — F17210 Nicotine dependence, cigarettes, uncomplicated: Secondary | ICD-10-CM | POA: Insufficient documentation

## 2018-08-21 DIAGNOSIS — Y929 Unspecified place or not applicable: Secondary | ICD-10-CM | POA: Insufficient documentation

## 2018-08-21 DIAGNOSIS — Z79899 Other long term (current) drug therapy: Secondary | ICD-10-CM | POA: Insufficient documentation

## 2018-08-21 DIAGNOSIS — R45851 Suicidal ideations: Secondary | ICD-10-CM | POA: Insufficient documentation

## 2018-08-21 DIAGNOSIS — F329 Major depressive disorder, single episode, unspecified: Secondary | ICD-10-CM | POA: Insufficient documentation

## 2018-08-21 DIAGNOSIS — F322 Major depressive disorder, single episode, severe without psychotic features: Secondary | ICD-10-CM | POA: Diagnosis present

## 2018-08-21 LAB — CBC WITH DIFFERENTIAL/PLATELET
Abs Immature Granulocytes: 0.07 10*3/uL (ref 0.00–0.07)
Basophils Absolute: 0.1 10*3/uL (ref 0.0–0.1)
Basophils Relative: 1 %
Eosinophils Absolute: 0.2 10*3/uL (ref 0.0–0.5)
Eosinophils Relative: 3 %
HCT: 44.3 % (ref 39.0–52.0)
Hemoglobin: 16 g/dL (ref 13.0–17.0)
Immature Granulocytes: 1 %
Lymphocytes Relative: 29 %
Lymphs Abs: 2.6 10*3/uL (ref 0.7–4.0)
MCH: 31.6 pg (ref 26.0–34.0)
MCHC: 36.1 g/dL — ABNORMAL HIGH (ref 30.0–36.0)
MCV: 87.5 fL (ref 80.0–100.0)
Monocytes Absolute: 0.7 10*3/uL (ref 0.1–1.0)
Monocytes Relative: 8 %
Neutro Abs: 5.3 10*3/uL (ref 1.7–7.7)
Neutrophils Relative %: 58 %
Platelets: 316 10*3/uL (ref 150–400)
RBC: 5.06 MIL/uL (ref 4.22–5.81)
RDW: 12 % (ref 11.5–15.5)
WBC: 9 10*3/uL (ref 4.0–10.5)
nRBC: 0 % (ref 0.0–0.2)

## 2018-08-21 LAB — URINALYSIS, COMPLETE (UACMP) WITH MICROSCOPIC
Bacteria, UA: NONE SEEN
Bilirubin Urine: NEGATIVE
Glucose, UA: NEGATIVE mg/dL
Hgb urine dipstick: NEGATIVE
Ketones, ur: NEGATIVE mg/dL
Leukocytes,Ua: NEGATIVE
Nitrite: NEGATIVE
Protein, ur: NEGATIVE mg/dL
Specific Gravity, Urine: 1.001 — ABNORMAL LOW (ref 1.005–1.030)
Squamous Epithelial / HPF: NONE SEEN (ref 0–5)
pH: 5 (ref 5.0–8.0)

## 2018-08-21 LAB — ACETAMINOPHEN LEVEL: Acetaminophen (Tylenol), Serum: <10 ug/mL — ABNORMAL LOW (ref 10–30)

## 2018-08-21 LAB — COMPREHENSIVE METABOLIC PANEL
ALT: 80 U/L — ABNORMAL HIGH (ref 0–44)
AST: 50 U/L — ABNORMAL HIGH (ref 15–41)
Albumin: 4.6 g/dL (ref 3.5–5.0)
Alkaline Phosphatase: 71 U/L (ref 38–126)
Anion gap: 9 (ref 5–15)
BUN: 9 mg/dL (ref 6–20)
CO2: 24 mmol/L (ref 22–32)
Calcium: 9.2 mg/dL (ref 8.9–10.3)
Chloride: 108 mmol/L (ref 98–111)
Creatinine, Ser: 0.84 mg/dL (ref 0.61–1.24)
GFR calc Af Amer: >60 mL/min (ref >60)
GFR calc non Af Amer: >60 mL/min (ref >60)
Glucose, Bld: 112 mg/dL — ABNORMAL HIGH (ref 70–99)
Potassium: 3.5 mmol/L (ref 3.5–5.1)
Sodium: 141 mmol/L (ref 135–145)
Total Bilirubin: 0.5 mg/dL (ref 0.3–1.2)
Total Protein: 8.2 g/dL — ABNORMAL HIGH (ref 6.5–8.1)

## 2018-08-21 LAB — ETHANOL: Alcohol, Ethyl (B): 158 mg/dL — ABNORMAL HIGH (ref <10)

## 2018-08-21 LAB — SALICYLATE LEVEL: Salicylate Lvl: <7 mg/dL (ref 2.8–30.0)

## 2018-08-21 MED ORDER — LORAZEPAM 2 MG PO TABS
0.0000 mg | ORAL_TABLET | Freq: Four times a day (QID) | ORAL | Status: DC
  Filled 2018-08-21: qty 2

## 2018-08-21 MED ORDER — LORAZEPAM 2 MG PO TABS: 0.0000 mg | ORAL_TABLET | Freq: Two times a day (BID) | ORAL | Status: DC

## 2018-08-21 MED ORDER — LORAZEPAM 2 MG/ML IJ SOLN: 0.0000 mg | Freq: Four times a day (QID) | INTRAMUSCULAR | Status: DC

## 2018-08-21 MED ORDER — THIAMINE HCL 100 MG/ML IJ SOLN: 100.0000 mg | Freq: Every day | INTRAMUSCULAR | Status: DC

## 2018-08-21 MED ORDER — VITAMIN B-1 100 MG PO TABS
100.0000 mg | ORAL_TABLET | Freq: Every day | ORAL | Status: DC
  Administered 2018-08-22: 100 mg via ORAL
  Filled 2018-08-21: qty 1

## 2018-08-21 MED ORDER — LORAZEPAM 2 MG/ML IJ SOLN: 0.0000 mg | Freq: Two times a day (BID) | INTRAMUSCULAR | Status: DC

## 2018-08-21 NOTE — ED Notes (Signed)
Pt had Kerlix wrapped around forearm wound.  Removed kerlex and cleaned wound with saline.  Steri strips applied to 2 small lacerations on l wrist.  Pt pleasant and cooperative.  Nonstick gaauze applied over steristrips and taped down with paper tape.  Pt resting in bed with lights out watching TV.

## 2018-08-21 NOTE — ED Notes (Addendum)
Pt dressed in hospital provided scrubs, pt belongings included:   2 lighters  2 packs of [place] red cigarettes  Wallet ID $10 bill  Baseball cap Blue tshirt  Gray shorts Grey underwear  [employer]  Edited to add: pt belongings in 2 labeled belonging bags

## 2018-08-21 NOTE — ED Notes (Signed)
Report received from [facility]. Pt observed resting in bed, with regular/even/unlabored respirations. Will continue to monitor for needs/safety.
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
## 2018-08-22 ENCOUNTER — Encounter (HOSPITAL_COMMUNITY): Payer: Self-pay | Admitting: *Deleted

## 2018-08-22 ENCOUNTER — Inpatient Hospital Stay (HOSPITAL_COMMUNITY)
Admission: AD | Admit: 2018-08-22 | Discharge: 2018-08-25 | DRG: 881 | Disposition: A | Discharge status: Home / Self Care | Payer: Federal, State, Local not specified - Other | Attending: Psychiatry | Admitting: Psychiatry

## 2018-08-22 ENCOUNTER — Other Ambulatory Visit: Payer: Self-pay | Admitting: Behavioral Health

## 2018-08-22 ENCOUNTER — Other Ambulatory Visit: Payer: Self-pay

## 2018-08-22 DIAGNOSIS — Z885 Allergy status to narcotic agent status: Secondary | ICD-10-CM

## 2018-08-22 DIAGNOSIS — F17210 Nicotine dependence, cigarettes, uncomplicated: Secondary | ICD-10-CM | POA: Diagnosis present

## 2018-08-22 DIAGNOSIS — F10129 Alcohol abuse with intoxication, unspecified: Secondary | ICD-10-CM | POA: Diagnosis present

## 2018-08-22 DIAGNOSIS — Y906 Blood alcohol level of 120-199 mg/100 ml: Secondary | ICD-10-CM | POA: Diagnosis present

## 2018-08-22 DIAGNOSIS — G4710 Hypersomnia, unspecified: Secondary | ICD-10-CM | POA: Diagnosis present

## 2018-08-22 DIAGNOSIS — F1014 Alcohol abuse with alcohol-induced mood disorder: Secondary | ICD-10-CM | POA: Diagnosis present

## 2018-08-22 DIAGNOSIS — R4689 Other symptoms and signs involving appearance and behavior: Secondary | ICD-10-CM | POA: Diagnosis present

## 2018-08-22 DIAGNOSIS — F329 Major depressive disorder, single episode, unspecified: Principal | ICD-10-CM | POA: Diagnosis present

## 2018-08-22 DIAGNOSIS — E7800 Pure hypercholesterolemia, unspecified: Secondary | ICD-10-CM | POA: Diagnosis present

## 2018-08-22 DIAGNOSIS — Z811 Family history of alcohol abuse and dependence: Secondary | ICD-10-CM | POA: Diagnosis not present

## 2018-08-22 DIAGNOSIS — E781 Pure hyperglyceridemia: Secondary | ICD-10-CM | POA: Diagnosis present

## 2018-08-22 DIAGNOSIS — F332 Major depressive disorder, recurrent severe without psychotic features: Secondary | ICD-10-CM | POA: Diagnosis not present

## 2018-08-22 DIAGNOSIS — F1024 Alcohol dependence with alcohol-induced mood disorder: Secondary | ICD-10-CM | POA: Diagnosis not present

## 2018-08-22 HISTORY — DX: Anxiety disorder, unspecified: F41.9

## 2018-08-22 HISTORY — DX: Depression, unspecified: F32.A

## 2018-08-22 LAB — SARS CORONAVIRUS 2 BY RT PCR (HOSPITAL ORDER, PERFORMED IN ~~LOC~~ HOSPITAL LAB): SARS Coronavirus 2: NEGATIVE

## 2018-08-22 MED ORDER — LOPERAMIDE HCL 2 MG PO CAPS: 2.0000 mg | ORAL_CAPSULE | ORAL | Status: DC | PRN

## 2018-08-22 MED ORDER — LORAZEPAM 1 MG PO TABS
1.0000 mg | ORAL_TABLET | Freq: Four times a day (QID) | ORAL | Status: DC | PRN
  Administered 2018-08-22: 1 mg via ORAL
  Filled 2018-08-22 (×2): qty 1

## 2018-08-22 MED ORDER — ALUM & MAG HYDROXIDE-SIMETH 200-200-20 MG/5ML PO SUSP: 30.0000 mL | ORAL | Status: DC | PRN

## 2018-08-22 MED ORDER — ADULT MULTIVITAMIN W/MINERALS CH
1.0000 | ORAL_TABLET | Freq: Every day | ORAL | Status: DC
  Administered 2018-08-23 – 2018-08-25 (×3): 1 via ORAL
  Filled 2018-08-22 (×5): qty 1

## 2018-08-22 MED ORDER — ACETAMINOPHEN 325 MG PO TABS: 650.0000 mg | ORAL_TABLET | Freq: Four times a day (QID) | ORAL | Status: DC | PRN

## 2018-08-22 MED ORDER — ONDANSETRON 4 MG PO TBDP: 4.0000 mg | ORAL_TABLET | Freq: Four times a day (QID) | ORAL | Status: DC | PRN

## 2018-08-22 MED ORDER — HYDROXYZINE HCL 25 MG PO TABS
25.0000 mg | ORAL_TABLET | Freq: Four times a day (QID) | ORAL | Status: DC | PRN
  Administered 2018-08-22 – 2018-08-24 (×3): 25 mg via ORAL
  Filled 2018-08-22 (×2): qty 1
  Filled 2018-08-22: qty 10
  Filled 2018-08-22: qty 1

## 2018-08-22 MED ORDER — THIAMINE HCL 100 MG/ML IJ SOLN
100.0000 mg | Freq: Once | INTRAMUSCULAR | Status: AC
  Administered 2018-08-22: 100 mg via INTRAMUSCULAR
  Filled 2018-08-22: qty 2

## 2018-08-22 MED ORDER — NICOTINE 21 MG/24HR TD PT24
21.0000 mg | MEDICATED_PATCH | Freq: Every day | TRANSDERMAL | Status: DC
  Administered 2018-08-22 – 2018-08-24 (×3): 21 mg via TRANSDERMAL
  Filled 2018-08-22 (×6): qty 1

## 2018-08-22 MED ORDER — SERTRALINE HCL 50 MG PO TABS
50.0000 mg | ORAL_TABLET | Freq: Every day | ORAL | Status: DC
  Administered 2018-08-22: 12:00:00 50 mg via ORAL
  Filled 2018-08-22: qty 1

## 2018-08-22 MED ORDER — VITAMIN B-1 100 MG PO TABS
100.0000 mg | ORAL_TABLET | Freq: Every day | ORAL | Status: DC
  Administered 2018-08-23 – 2018-08-25 (×3): 100 mg via ORAL
  Filled 2018-08-22 (×5): qty 1

## 2018-08-22 MED ORDER — SERTRALINE HCL 50 MG PO TABS
50.0000 mg | ORAL_TABLET | Freq: Every day | ORAL | Status: DC
  Administered 2018-08-23 – 2018-08-25 (×3): 50 mg via ORAL
  Filled 2018-08-22: qty 1
  Filled 2018-08-22: qty 7
  Filled 2018-08-22 (×4): qty 1

## 2018-08-22 NOTE — H&P (Signed)
Psychiatric Admission Assessment Adult  Patient Identification: Mario Coffey MRN:  [id number] Date of Evaluation:  08/22/2018 Chief Complaint: " I don't know why I did it"  Principal Diagnosis: Suicide Attempt, Alcohol Use Disorder, Alcohol Induced Mood Disorder Diagnosis:  Suicide Attempt, Alcohol Use Disorder, Alcohol Induced Mood Disorder History of Present Illness: 28 year old male. Presented to ED yesterday via EMS . States " I got into an argument with my girlfriend, I was really drunk". States GF had called police, and as they arrived he impulsively cut self on forearm. Has visible linear  cut /laceration on forearm. He states that " in the heat of the moment, I was just mad, I guess, I don't know why I did it ". He also reportedly asked police to shoot him. At this time denies SI and states " I didn't really want to die, I have my kids to think of ". He reports he knows alcohol intoxication played a role in impulsive self injurious behavior. He had a prior admission for similar presentation/intoxication in January 2020. States that prior to above events " I was doing OK, things were all right", and denies having felt particularly depressed recently. Denies having had any suicidal ideations prior to above incident . Endorses some neuro-vegetative symptoms, mainly hypersomnia, but denies anhedonia, persistent sadness or any SI prior to incident . He endorses history of alcohol use disorder, but states he has been drinking less than before- used to drink daily, but states has drank 3 times over last 2 weeks. Currently presents calm, comfortable and in no acute distress,he is not presenting with symptoms of WDL. Admission BAL 158.  Associated Signs/Symptoms: Depression Symptoms:  hypersomnia, suicidal attempt, (Hypo) Manic Symptoms:  Some irritability Anxiety Symptoms: denies increased or significant anxiety Psychotic Symptoms:  denies  PTSD Symptoms: Denies  Total Time spent with  patient: 45 minutes  Past Psychiatric History: One prior psychiatric admission in January 2020. At the time was admitted due to alcohol intoxication, suicidal ideation of cutting throat /self cutting, which he states was related to argument with SO and intoxication. At the time was discharged on Zoloft .  Denies history of severe depression, but does endorse history of worsening depression at times  when he drinks , denies history of mania or hypomania,denies history of PTSD.  Denies history of psychosis.  Is the patient at risk to self? Yes.    Has the patient been a risk to self in the past 6 months? No.  Has the patient been a risk to self within the distant past? Yes.    Is the patient a risk to others? No.  Has the patient been a risk to others in the past 6 months? No.  Has the patient been a risk to others within the distant past? No.   Prior Inpatient Therapy:  as above Prior Outpatient Therapy:  none at this time  Alcohol Screening:   Substance Abuse History in the last 12 months:  Reports history of alcohol use disorder, had been drinking up to 18 beers per day , but states " I have slowed down a lot", and states that has consumed alcohol 2-3 times over the last 2 weeks. Denies drug abuse . Consequences of Substance Abuse: Past history of alcohol related blackouts, no history of WDL seizures, history of DUI charge. Previous Psychotropic Medications:  Was not taking any medications prior to admission.  Reports he was briefly treated with Zoloft following a prior psychiatric admission in  January.  Took if for 1-2 weeks only, as had difficulty renewing it, but states " I liked it, I could tell it was helping " Psychological Evaluations: No  Past Medical History: history of asthma, but states it has improved over recent years . Allergic to Codeine Past Medical History:  Diagnosis Date  . Asthma    No past surgical history on file. Family History: parents alive, separated, has two  brothers and one sister Family Psychiatric  History: no history of mental illness or of suicides in family. Mother and grandfather have history of alcohol use disorder Tobacco Screening:  Smokes  1.5 PPD  Social History: 63, single, three children ( 8, 3, 9 months), lives with GF and children, currently unemployed.  Social History   Substance and Sexual Activity  Alcohol Use Yes  . Alcohol/week: 2.0 standard drinks  . Types: 2 Cans of beer per week   Comment: daily     Social History   Substance and Sexual Activity  Drug Use Not Currently    Additional Social History:  Allergies:   Allergies  Allergen Reactions  . Codeine Nausea And Vomiting   Lab Results:  Results for orders placed or performed during the hospital encounter of 08/21/18 (from the past 48 hour(s))  CBC with Differential     Status: Abnormal   Collection Time: 08/21/18 10:33 PM  Result Value Ref Range   WBC 9.0 4.0 - 10.5 K/uL   RBC 5.06 4.22 - 5.81 MIL/uL   Hemoglobin 16.0 13.0 - 17.0 g/dL   HCT 16.1 09.6 - 04.5 %   MCV 87.5 80.0 - 100.0 fL   MCH 31.6 26.0 - 34.0 pg   MCHC 36.1 (H) 30.0 - 36.0 g/dL   RDW 40.9 81.1 - 91.4 %   Platelets 316 150 - 400 K/uL   nRBC 0.0 0.0 - 0.2 %   Neutrophils Relative % 58 %   Neutro Abs 5.3 1.7 - 7.7 K/uL   Lymphocytes Relative 29 %   Lymphs Abs 2.6 0.7 - 4.0 K/uL   Monocytes Relative 8 %   Monocytes Absolute 0.7 0.1 - 1.0 K/uL   Eosinophils Relative 3 %   Eosinophils Absolute 0.2 0.0 - 0.5 K/uL   Basophils Relative 1 %   Basophils Absolute 0.1 0.0 - 0.1 K/uL   Immature Granulocytes 1 %   Abs Immature Granulocytes 0.07 0.00 - 0.07 K/uL    Comment: Performed at [facility], [address] [place], [place] [address]  Ethanol     Status: Abnormal   Collection Time: 08/21/18 10:33 PM  Result Value Ref Range   Alcohol, Ethyl (B) 158 (H) <10 mg/dL    Comment: (NOTE) Lowest detectable limit for serum alcohol is 10 mg/dL. For medical purposes  only. Performed at [facility], [address] [place], [place] [address]   Comprehensive metabolic panel     Status: Abnormal   Collection Time: 08/21/18 10:33 PM  Result Value Ref Range   Sodium 141 135 - 145 mmol/L   Potassium 3.5 3.5 - 5.1 mmol/L   Chloride 108 98 - 111 mmol/L   CO2 24 22 - 32 mmol/L   Glucose, Bld 112 (H) 70 - 99 mg/dL   BUN 9 6 - 20 mg/dL   Creatinine, Ser 8.65 0.61 - 1.24 mg/dL   Calcium 9.2 8.9 - 78.4 mg/dL   Total Protein 8.2 (H) 6.5 - 8.1 g/dL   Albumin 4.6 3.5 - 5.0 g/dL   AST 50 (H)  15 - 41 U/L   ALT 80 (H) 0 - 44 U/L   Alkaline Phosphatase 71 38 - 126 U/L   Total Bilirubin 0.5 0.3 - 1.2 mg/dL   GFR calc non Af Amer >60 >60 mL/min   GFR calc Af Amer >60 >60 mL/min   Anion gap 9 5 - 15    Comment: Performed at [facility], [address] [place], [place] [address]  Acetaminophen level     Status: Abnormal   Collection Time: 08/21/18 10:33 PM  Result Value Ref Range   Acetaminophen (Tylenol), Serum <10 (L) 10 - 30 ug/mL    Comment: (NOTE) Therapeutic concentrations vary significantly. A range of 10-30 ug/mL  may be an effective concentration for many patients. However, some  are best treated at concentrations outside of this range. Acetaminophen concentrations >150 ug/mL at 4 hours after ingestion  and >50 ug/mL at 12 hours after ingestion are often associated with  toxic reactions. Performed at [facility], [address] [place], [place] [address]   Salicylate level     Status: None   Collection Time: 08/21/18 10:33 PM  Result Value Ref Range   Salicylate Lvl <7.0 2.8 - 30.0 mg/dL    Comment: Performed at [facility], [address] [place], [place] [address]  Urinalysis, Complete w Microscopic     Status: Abnormal   Collection Time: 08/21/18 10:33 PM  Result Value Ref Range   Color, Urine COLORLESS (A) YELLOW   APPearance CLEAR (A) CLEAR   Specific Gravity, Urine 1.001 (L) 1.005 - 1.030    pH 5.0 5.0 - 8.0   Glucose, UA NEGATIVE NEGATIVE mg/dL   Hgb urine dipstick NEGATIVE NEGATIVE   Bilirubin Urine NEGATIVE NEGATIVE   Ketones, ur NEGATIVE NEGATIVE mg/dL   Protein, ur NEGATIVE NEGATIVE mg/dL   Nitrite NEGATIVE NEGATIVE   Leukocytes,Ua NEGATIVE NEGATIVE   RBC / HPF 0-5 0 - 5 RBC/hpf   WBC, UA 0-5 0 - 5 WBC/hpf   Bacteria, UA NONE SEEN NONE SEEN   Squamous Epithelial / LPF NONE SEEN 0 - 5    Comment: Performed at [facility], [address] [place], [place] [address]  SARS Coronavirus 2 [facility](Hospital order, Performed in [facility] hospital lab) Nasopharyngeal Nasopharyngeal Swab     Status: None   Collection Time: 08/22/18 10:20 AM   Specimen: Nasopharyngeal Swab  Result Value Ref Range   SARS Coronavirus 2 NEGATIVE NEGATIVE    Comment: (NOTE) If result is NEGATIVE SARS-CoV-2 target nucleic acids are NOT DETECTED. The SARS-CoV-2 RNA is generally detectable in upper and lower  respiratory specimens during the acute phase of infection. The lowest  concentration of SARS-CoV-2 viral copies this assay can detect is 250  copies / mL. A negative result does not preclude SARS-CoV-2 infection  and should not be used as the sole basis for treatment or other  patient management decisions.  A negative result may occur with  improper specimen collection / handling, submission of specimen other  than nasopharyngeal swab, presence of viral mutation(s) within the  areas targeted by this assay, and inadequate number of viral copies  (<250 copies / mL). A negative result must be combined with clinical  observations, patient history, and epidemiological information. If result is POSITIVE SARS-CoV-2 target nucleic acids are DETECTED. The SARS-CoV-2 RNA is generally detectable in upper and lower  respiratory specimens dur ing the acute phase of infection.  Positive  results are indicative of active infection with  SARS-CoV-2.  Clinical  correlation with patient history and other  diagnostic information is  necessary to determine patient infection status.  Positive results do  not rule out bacterial infection or co-infection with other viruses. If result is PRESUMPTIVE POSTIVE SARS-CoV-2 nucleic acids MAY BE PRESENT.   A presumptive positive result was obtained on the submitted specimen  and confirmed on repeat testing.  While 2019 novel coronavirus  (SARS-CoV-2) nucleic acids may be present in the submitted sample  additional confirmatory testing may be necessary for epidemiological  and / or clinical management purposes  to differentiate between  SARS-CoV-2 and other Sarbecovirus currently known to infect humans.  If clinically indicated additional testing with an alternate test  methodology [phone]) is advised. The SARS-CoV-2 RNA is generally  detectable in upper and lower respiratory sp ecimens during the acute  phase of infection. The expected result is Negative. Fact Sheet for Patients:  StrictlyIdeas.no Fact Sheet for Healthcare Providers: BankingDealers.co.za This test is not yet approved or cleared by the [place] FDA and has been authorized for detection and/or diagnosis of SARS-CoV-2 by FDA under an Emergency Use Authorization (EUA).  This EUA will remain in effect (meaning this test can be used) for the duration of the COVID-19 declaration under Section 564(b)(1) of the Act, 21 U.S.C. section 360bbb-3(b)(1), unless the authorization is terminated or revoked sooner. Performed at [facility], [address] [place], [place] [address]     Blood Alcohol level:  Lab Results  Component Value Date   ETH 158 (H) 08/21/2018   ETH 201 (H) 96/78/9381    Metabolic Disorder Labs:  No results found for: HGBA1C, MPG No results found for: PROLACTIN No results found for: CHOL, TRIG, HDL, CHOLHDL, VLDL, LDLCALC  Current Medications: No current facility-administered medications for this  encounter.    PTA Medications: No medications prior to admission.    Musculoskeletal: Strength & Muscle Tone: within normal limits no tremors, no diaphoresis, no restlessness or agitation Gait & Station: normal Patient leans: N/A  Psychiatric Specialty Exam: Physical Exam  Review of Systems  Constitutional: Negative.  Negative for chills and fever.  HENT: Negative.   Eyes: Negative.   Respiratory: Negative for shortness of breath.   Cardiovascular: Negative for chest pain.  Gastrointestinal: Negative.  Negative for diarrhea, nausea and vomiting.  Genitourinary: Negative.   Musculoskeletal: Negative.   Skin: Negative.  Negative for rash.  Neurological: Negative for seizures and headaches.  Endo/Heme/Allergies: Negative.   Psychiatric/Behavioral: Positive for depression and suicidal ideas.    There were no vitals taken for this visit.There is no height or weight on file to calculate BMI.  General Appearance: Fairly Groomed  Eye Contact:  Fair  Speech:  Normal Rate  Volume:  Normal  Mood:  vaguely depressed, reports mood as 5/10 today  Affect:  vaguely dysphoric, constricted   Thought Process:  Linear and Descriptions of Associations: Intact  Orientation:  Other:  fully alert and attentive  Thought Content:  no hallucinations, no delusions   Suicidal Thoughts:  No denies suicidal or self injurious ideations, denies homicidal or violent ideations, contracts for safety on unit   Homicidal Thoughts:  No  Memory:  recent and remote grossly intact  Judgement:  Fair  Insight:  Fair  Psychomotor Activity:  Normal- no current tremors or diaphoresis, no restlessness or agitation  Concentration:  Concentration: Good and Attention Span: Good  Recall:  Good  Fund of Knowledge:  Good  Language:  Good  Akathisia:  Negative  Handed:  Right  AIMS (if indicated):     Assets:  Communication Skills Desire for Improvement Resilience  ADL's:  Intact  Cognition:  WNL  Sleep:        Treatment Plan Summary: Daily contact with patient to assess and evaluate symptoms and progress in treatment, Medication management, Plan inpatient treatment  and medications as below  Observation Level/Precautions:  15 min  Laboratory:  as needed  Psychotherapy:  Milieu, group therapy  Medications:  Patient states Zoloft trial was well tolerated and helpful in the past, interested in restarting. Side effects discussed. No current WDL symptoms- will continue Ativan PRN for alcohol WDL as needed as per CIWA protocol. Start Zoloft 50 mgrs QDAY for depression  Consultations:  As needed   Discharge Concerns:  -   Estimated LOS: 4 days   Other:     Physician Treatment Plan for Primary Diagnosis:  Alcohol Use Disorder, Alcohol Induced Mood Disorder versus MDD Long Term Goal(s): Improvement in symptoms so as ready for discharge  Short Term Goals: Ability to identify changes in lifestyle to reduce recurrence of condition will improve, Ability to verbalize feelings will improve, Ability to disclose and discuss suicidal ideas, Ability to demonstrate self-control will improve, Ability to identify and develop effective coping behaviors will improve and Compliance with prescribed medications will improve  Physician Treatment Plan for Secondary Diagnosis:  Suicide Attempt Long Term Goal(s): Improvement in symptoms so as ready for discharge  Short Term Goals: Ability to identify changes in lifestyle to reduce recurrence of condition will improve, Ability to verbalize feelings will improve, Ability to disclose and discuss suicidal ideas, Ability to demonstrate self-control will improve, Ability to identify and develop effective coping behaviors will improve and Ability to maintain clinical measurements within normal limits will improve  I certify that inpatient services furnished can reasonably be expected to improve the patient's condition.    Craige CottaFernando A Lasean Gorniak, MD 8/4/20205:17 PM

## 2018-08-22 NOTE — ED Notes (Signed)
Steri-strips applied per previous shift RN; remain intact at this time.

## 2018-08-22 NOTE — ED Notes (Signed)
Belongings given to deputy transporting pt

## 2018-08-22 NOTE — BH Assessment (Signed)
Patient has been accepted to [facility].  Patient assigned to room 302-2 Accepting physician is Dr. Parke Poisson.  Call report to [phone].  Representative was [employer].   ER Staff is aware of it:  Lattie Haw, ER Secretary  Dr. Charna Archer, ER MD  Caryl Pina, Patient's Nurse
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
## 2018-08-22 NOTE — Progress Notes (Signed)
[place] NOVEL CORONAVIRUS (COVID-19) DAILY CHECK-OFF SYMPTOMS - answer yes or no to each - every day NO YES  Have you had a fever in the past 24 hours?  . Fever (Temp > 37.80C / 100F) X   Have you had any of these symptoms in the past 24 hours? . New Cough .  Sore Throat  .  Shortness of Breath .  Difficulty Breathing .  Unexplained Body Aches   X   Have you had any one of these symptoms in the past 24 hours not related to allergies?   . Runny Nose .  Nasal Congestion .  Sneezing   X   If you have had runny nose, nasal congestion, sneezing in the past 24 hours, has it worsened?  X   EXPOSURES - check yes or no X   Have you traveled outside the state in the past 14 days?  X   Have you been in contact with someone with a confirmed diagnosis of COVID-19 or PUI in the past 14 days without wearing appropriate PPE?  X   Have you been living in the same home as a person with confirmed diagnosis of COVID-19 or a PUI (household contact)?    X   Have you been diagnosed with COVID-19?    X              What to do next: Answered NO to all: Answered YES to anything:   Proceed with unit schedule Follow the [facility] Inpatient Flowsheet.   

## 2018-08-22 NOTE — ED Notes (Signed)
[facility]  DEPT  CALLED  FOR  TRANSFER  TO  [facility]

## 2018-08-22 NOTE — Plan of Care (Signed)
Nurse discussed anxiety, depression, coping skills and substance abuse with patient.

## 2018-08-22 NOTE — Progress Notes (Signed)
Adult Psychoeducational Group Note  Date:  08/22/2018 Time:  8:55 PM  Group Topic/Focus:  Wrap-Up Group:   The focus of this group is to help patients review their daily goal of treatment and discuss progress on daily workbooks.  Participation Level:  Active  Participation Quality:  Appropriate  Affect:  Appropriate  Cognitive:  Appropriate  Insight: Appropriate  Engagement in Group:  Engaged  Modes of Intervention:  Discussion  Additional Comments:  Patient attended group and said that his day was a 5. He did not have a goal for today.   Domenique Quest W Jeffie Widdowson 05/27/2922, 8:55 PM

## 2018-08-22 NOTE — ED Provider Notes (Signed)
Patient accepted for behavioral health admission to [facility] and transported without issue.   Blake Divine, MD 08/22/18 [phone]
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
## 2018-08-22 NOTE — ED Notes (Signed)
Psychiatry to bedside at this time. 
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
## 2018-08-22 NOTE — ED Notes (Signed)
Pt ambulatory to bathroom independently

## 2018-08-22 NOTE — [facility] Suicide Risk Assessment (Signed)
[facility] Admission Suicide Risk Assessment   Nursing information obtained from:  Patient Demographic factors:  Male, Unemployed, Low socioeconomic status Current Mental Status:  Self-harm thoughts, Self-harm behaviors, Suicidal ideation indicated by patient Loss Factors:  Decrease in vocational status, Loss of significant relationship, Financial problems / change in socioeconomic status Historical Factors:  Impulsivity Risk Reduction Factors:  Responsible for children under 83 years of age, Living with another person, especially a relative, Sense of responsibility to family  Total Time spent with patient: 45 minutes Principal Problem: Alcohol Use Disorder, Alcohol Induced Mood Disorder  Diagnosis:  Active Problems:   MDD (major depressive disorder)  Subjective Data:   Continued Clinical Symptoms:  Alcohol Use Disorder Identification Test Final Score (AUDIT): 36 The "Alcohol Use Disorders Identification Test", Guidelines for Use in Primary Care, Second Edition.  World Pharmacologist [facility]). Score between 0-7:  no or low risk or alcohol related problems. Score between 8-15:  moderate risk of alcohol related problems. Score between 16-19:  high risk of alcohol related problems. Score 20 or above:  warrants further diagnostic evaluation for alcohol dependence and treatment.   CLINICAL FACTORS:  28 y old male, presented following self inflicted lacerations on forearm, making suicidal statements in the context of argument with GF and alcohol intoxication.    Psychiatric Specialty Exam: Physical Exam  ROS  Blood pressure (!) 127/91, pulse (!) 102, temperature 98.4 F (36.9 C), temperature source Oral, resp. rate 18, height 6' (1.829 m), SpO2 97 %.Body mass index is 31.19 kg/m.  See admit note MSE  COGNITIVE FEATURES THAT CONTRIBUTE TO RISK:  Closed-mindedness and Loss of executive function    SUICIDE RISK:   Moderate:  Frequent suicidal ideation with limited intensity, and duration,  some specificity in terms of plans, no associated intent, good self-control, limited dysphoria/symptomatology, some risk factors present, and identifiable protective factors, including available and accessible social support.  PLAN OF CARE: Patient will be admitted to inpatient psychiatric unit for stabilization and safety. Will provide and encourage milieu participation. Provide medication management and maked adjustments as needed. Will also provide medication management to address WDL if needed.  Will follow daily.    I certify that inpatient services furnished can reasonably be expected to improve the patient's condition.   Jenne Campus, MD 08/22/2018, 5:51 PM

## 2018-08-22 NOTE — Progress Notes (Signed)
EKG completed per MD order and placed on patient's chart.

## 2018-08-22 NOTE — Progress Notes (Signed)
Mario Coffey observe watching TV. He is quiet in the milieu. He denies SI/HI/AVH at this time. Pt c/o of anxiety and mild withdrawal s/s. Pt is pleasant upon interaction. No issues noted. Support given and safety maintained.

## 2018-08-22 NOTE — ED Notes (Signed)
Pt using phone to update pt on tranfer per pt request

## 2018-08-22 NOTE — ED Notes (Signed)
Pt given lunch tray.

## 2018-08-22 NOTE — ED Notes (Signed)
Pt asleep, breakfast tray palced in rm.

## 2018-08-22 NOTE — ED Notes (Signed)
Referral information for Psychiatric Hospitalization faxed to;   [facility] [phone]),   . [facility] ([phone]--[phone]) . [facility] (-[phone] -or- [phone]) [phone]  . [facility] [phone]),   . [facility] [phone] -or- [phone]),   . [facility] [phone] or [phone])

## 2018-08-23 LAB — URINE CULTURE: Culture: NO GROWTH

## 2018-08-23 LAB — LIPID PANEL
Cholesterol: 220 mg/dL — ABNORMAL HIGH (ref 0–200)
HDL: 34 mg/dL — ABNORMAL LOW (ref >40)
LDL Cholesterol: 139 mg/dL — ABNORMAL HIGH (ref 0–99)
Total CHOL/HDL Ratio: 6.5 RATIO
Triglycerides: 235 mg/dL — ABNORMAL HIGH (ref <150)
VLDL: 47 mg/dL — ABNORMAL HIGH (ref 0–40)

## 2018-08-23 LAB — TSH: TSH: 2 u[IU]/mL (ref 0.350–4.500)

## 2018-08-23 LAB — HEMOGLOBIN A1C
Hgb A1c MFr Bld: 5.3 % (ref 4.8–5.6)
Mean Plasma Glucose: 105.41 mg/dL

## 2018-08-23 MED ORDER — TRAZODONE HCL 50 MG PO TABS
50.0000 mg | ORAL_TABLET | Freq: Every evening | ORAL | Status: DC | PRN
  Administered 2018-08-23: 50 mg via ORAL
  Filled 2018-08-23: qty 1

## 2018-08-23 NOTE — Progress Notes (Signed)
[facility] MD Progress Note  08/23/2018 12:55 PM Mario Coffey  MRN:  [id number] Subjective: Patient reports feeling better than he did prior to admission.  At this time denies suicidal ideations.  Does not endorse significant alcohol withdrawal symptoms.  Currently does not endorse medication side effects. Objective: I have discussed case with treatment team and have met with patient. 28 y old male, presented following self inflicted lacerations on forearm, making suicidal statements in the context of argument with GF and alcohol intoxication.   Currently patient presents calm, in no acute distress or discomfort, without tremors or diaphoresis or restlessness.  Blood pressure 129/90, pulse 98.  No current symptoms of significant alcohol withdrawal. Reports he is feeling "a lot better" although affect does remain vaguely constricted, although tends to improve during session. Denies suicidal ideations at this time.  Also denies any homicidal or violent ideations and specifically denies any violent ideations towards his girlfriend. He presents future oriented and states that he is thinking he will move in with his parents for period of time rather than returning home to girlfriend.  States "I need some time to myself". No disruptive or agitated behaviors on unit, visible in dayroom, going to some groups, behavior in good control.  Labs reviewed-lipid panel remarkable for mild hypercholesterolemia/hypertriglyceridemia, TSH 2.0, hemoglobin A1c 5.3 EKG-NSR, QTc 458 Principal Problem: Alcohol use disorder, alcohol induced mood disorder versus MDD Diagnosis: Active Problems:   MDD (major depressive disorder)  Total Time spent with patient: 26 minutes  Past Psychiatric History:   Past Medical History:  Past Medical History:  Diagnosis Date  . Anxiety   . Asthma   . Depression    History reviewed. No pertinent surgical history. Family History: History reviewed. No pertinent family history. Family  Psychiatric  History: Social History:  Social History   Substance and Sexual Activity  Alcohol Use Yes  . Alcohol/week: 2.0 standard drinks  . Types: 2 Cans of beer per week   Comment: daily     Social History   Substance and Sexual Activity  Drug Use Not Currently    Social History   Socioeconomic History  . Marital status: Single    Spouse name: Not on file  . Number of children: Not on file  . Years of education: Not on file  . Highest education level: Not on file  Occupational History  . Not on file  Social Needs  . Financial resource strain: Not on file  . Food insecurity    Worry: Not on file    Inability: Not on file  . Transportation needs    Medical: Not on file    Non-medical: Not on file  Tobacco Use  . Smoking status: Current Every Day Smoker    Packs/day: 1.50    Years: 5.00    Pack years: 7.50    Types: Cigarettes  . Smokeless tobacco: Never Used  Substance and Sexual Activity  . Alcohol use: Yes    Alcohol/week: 2.0 standard drinks    Types: 2 Cans of beer per week    Comment: daily  . Drug use: Not Currently  . Sexual activity: Not on file  Lifestyle  . Physical activity    Days per week: Not on file    Minutes per session: Not on file  . Stress: Not on file  Relationships  . Social Herbalist on phone: Not on file    Gets together: Not on file    Attends religious service:  Not on file    Active member of club or organization: Not on file    Attends meetings of clubs or organizations: Not on file    Relationship status: Not on file  Other Topics Concern  . Not on file  Social History Narrative  . Not on file   Additional Social History:    Pain Medications: see MAR Prescriptions: see MAR Over the Counter: see MAR History of alcohol / drug use?: Yes Longest period of sobriety (when/how long): unsure Negative Consequences of Use: Financial, Personal relationships Withdrawal Symptoms: Irritability Name of Substance 1:  alcohol 1 - Age of First Use: 28 yrs old 1 - Amount (size/oz): one and half pack beer daily 1 - Frequency: daily 1 - Duration: starting drinking alcohol at age 28 yrs old 1 - Last Use / Amount: 08/21/2018 Name of Substance 2: cigarettes 2 - Age of First Use: 28 yrs old 2 - Amount (size/oz): 1.5 packs daily cigarettes 2 - Frequency: daily 2 - Duration: sinceage of 28 yrs old, started smoking cigarettes 2 - Last Use / Amount: 08/22/2018  Sleep: Good  Appetite:  Good  Current Medications: Current Facility-Administered Medications  Medication Dose Route Frequency Provider Last Rate Last Dose  . acetaminophen (TYLENOL) tablet 650 mg  650 mg Oral Q6H PRN Mordecai Maes, NP      . alum & mag hydroxide-simeth (MAALOX/MYLANTA) 200-200-20 MG/5ML suspension 30 mL  30 mL Oral Q4H PRN Mordecai Maes, NP      . hydrOXYzine (ATARAX/VISTARIL) tablet 25 mg  25 mg Oral Q6H PRN Mordecai Maes, NP   25 mg at 08/22/18 2132  . loperamide (IMODIUM) capsule 2-4 mg  2-4 mg Oral PRN Mordecai Maes, NP      . LORazepam (ATIVAN) tablet 1 mg  1 mg Oral Q6H PRN Mordecai Maes, NP   1 mg at 08/22/18 2132  . multivitamin with minerals tablet 1 tablet  1 tablet Oral Daily Mordecai Maes, NP   1 tablet at 08/23/18 [phone]  . nicotine (NICODERM CQ - dosed in mg/24 hours) patch 21 mg  21 mg Transdermal Daily Cobos, Myer Peer, MD   21 mg at 08/23/18 0807  . ondansetron (ZOFRAN-ODT) disintegrating tablet 4 mg  4 mg Oral Q6H PRN Mordecai Maes, NP      . sertraline (ZOLOFT) tablet 50 mg  50 mg Oral Daily Cobos, Myer Peer, MD   50 mg at 08/23/18 0808  . thiamine (VITAMIN B-1) tablet 100 mg  100 mg Oral Daily Mordecai Maes, NP   100 mg at 08/23/18 3016    Lab Results:  Results for orders placed or performed during the hospital encounter of 08/22/18 (from the past 48 hour(s))  Hemoglobin A1c     Status: None   Collection Time: 08/23/18  6:29 AM  Result Value Ref Range   Hgb A1c MFr Bld 5.3 4.8 - 5.6 %     Comment: (NOTE) Pre diabetes:          5.7%-6.4% Diabetes:              >6.4% Glycemic control for   <7.0% adults with diabetes    Mean Plasma Glucose 105.41 mg/dL    Comment: Performed at [facility], [place] [address], [place], [place] [address]  Lipid panel     Status: Abnormal   Collection Time: 08/23/18  6:29 AM  Result Value Ref Range   Cholesterol 220 (H) 0 - 200 mg/dL   Triglycerides 235 (H) <150  mg/dL   HDL 34 (L) >40 mg/dL   Total CHOL/HDL Ratio 6.5 RATIO   VLDL 47 (H) 0 - 40 mg/dL   LDL Cholesterol 139 (H) 0 - 99 mg/dL    Comment:        Total Cholesterol/HDL:CHD Risk Coronary Heart Disease Risk Table                     Men   Women  1/2 Average Risk   3.4   3.3  Average Risk       5.0   4.4  2 X Average Risk   9.6   7.1  3 X Average Risk  23.4   11.0        Use the calculated Patient Ratio above and the CHD Risk Table to determine the patient's CHD Risk.        ATP III CLASSIFICATION (LDL):  <100     mg/dL   Optimal  100-129  mg/dL   Near or Above                    Optimal  130-159  mg/dL   Borderline  160-189  mg/dL   High  >190     mg/dL   Very High Performed at [facility] [address], [place], [place] [address]   TSH     Status: None   Collection Time: 08/23/18  6:29 AM  Result Value Ref Range   TSH 2.000 0.350 - 4.500 uIU/mL    Comment: Performed by a 3rd Generation assay with a functional sensitivity of <=0.01 uIU/mL. Performed at [facility], [place] [address] [place], [place] [address]     Blood Alcohol level:  Lab Results  Component Value Date   ETH 158 (H) 08/21/2018   ETH 201 (H) 19/62/2297    Metabolic Disorder Labs: Lab Results  Component Value Date   HGBA1C 5.3 08/23/2018   MPG 105.41 08/23/2018   No results found for: PROLACTIN Lab Results  Component Value Date   CHOL 220 (H) 08/23/2018   TRIG 235 (H) 08/23/2018   HDL 34 (L) 08/23/2018   CHOLHDL 6.5 08/23/2018   VLDL 47 (H)  08/23/2018   LDLCALC 139 (H) 08/23/2018    Physical Findings: AIMS: Facial and Oral Movements Muscles of Facial Expression: None, normal Lips and Perioral Area: None, normal Jaw: None, normal Tongue: None, normal,Extremity Movements Upper (arms, wrists, hands, fingers): None, normal Lower (legs, knees, ankles, toes): None, normal, Trunk Movements Neck, shoulders, hips: None, normal, Overall Severity Severity of abnormal movements (highest score from questions above): None, normal Incapacitation due to abnormal movements: None, normal Patient's awareness of abnormal movements (rate only patient's report): No Awareness, Dental Status Current problems with teeth and/or dentures?: No Does patient usually wear dentures?: No  CIWA:  CIWA-Ar Total: 1 COWS:  COWS Total Score: 3  Musculoskeletal: Strength & Muscle Tone: within normal limits-no tremors, no diaphoresis, no restlessness, no psychomotor agitation Gait & Station: normal Patient leans: N/A  Psychiatric Specialty Exam: Physical Exam  ROS denies headache, denies chest pain, no shortness of breath, no cough, no fever, no chills  Blood pressure 129/90, pulse 98, temperature 98.6 F (37 C), temperature source Oral, resp. rate 18, height 6' (1.829 m), weight 125.2 kg, SpO2 95 %.Body mass index is 37.43 kg/m.  General Appearance: Improving grooming  Eye Contact:  Good  Speech:  Normal Rate  Volume:  Normal  Mood:  Reports  feeling better than he did prior to admission  Affect:  Less constricted, tends to improve during session  Thought Process:  Linear and Descriptions of Associations: Intact  Orientation:  Full (Time, Place, and Person)  Thought Content:  No hallucinations, no delusions   Suicidal Thoughts:  No currently denies suicidal or self-injurious ideations, contracts for safety on unit, also denies any homicidal or violent ideations  Homicidal Thoughts:  No  Memory:  Recent and remote grossly intact  Judgement:  Other:   Improving  Insight:  Improving  Psychomotor Activity:  Normal  Concentration:  Concentration: Good and Attention Span: Good  Recall:  Good  Fund of Knowledge:  Good  Language:  Good  Akathisia:  Negative  Handed:  Right  AIMS (if indicated):     Assets:  Communication Skills Desire for Improvement Resilience  ADL's:  Intact  Cognition:  WNL  Sleep:  Number of Hours: 6.25   Assessment: 79 y old male, presented following self inflicted lacerations on forearm, making suicidal statements in the context of argument with GF and alcohol intoxication.   Patient describes feeling better today, and does not endorse major or significant neurovegetative symptoms of depression at this time.  Denies SI or self-injurious ideations.  Continues to attributes recent episode of self cutting at least partially to being intoxicated at the time and describes previous instances where he has also been more prone to impulsive behaviors in the context of heavy drinking.  At this time he is future oriented and states for example that he plans to move in with his father for period of time rather than returning home to girlfriend. Tolerating medications well thus far.  He is not presenting with symptoms of alcohol withdrawal at this time Treatment Plan Summary: Daily contact with patient to assess and evaluate symptoms and progress in treatment, Medication management, Plan Inpatient treatment and Medications as below Encourage group and milieu participation Encourage efforts to work on sobriety and relapse prevention Continue Vistaril 25 mg every 6 hours PRN for anxiety Continue Ativan 1 mg every 6 hours PRN for potential alcohol withdrawal, continue thiamine and folate supplementation Continue Zoloft at 50 mg daily for depression/anxiety Continue NicoDerm patch for nicotine craving Treatment team working on disposition planning options Jenne Campus, MD 08/23/2018, 12:55 PM

## 2018-08-23 NOTE — Progress Notes (Signed)
Mario Coffey observe watching TV. He is quiet in the milieu. He denies SI/HI/AVH at this time. Pt c/o of anxiety and sleep disturbance.Pt is pleasant upon interaction. Provider on call notified. See MAR. Support given and safety maintained.

## 2018-08-23 NOTE — Progress Notes (Signed)
Patient ID: Lakota Markgraf, male   DOB: 25-Dec-1990, 28 y.o.   MRN: [id number] [place] NOVEL CORONAVIRUS (COVID-19) DAILY CHECK-OFF SYMPTOMS - answer yes or no to each - every day NO YES  Have you had a fever in the past 24 hours?  . Fever (Temp > 37.80C / 100F) X   Have you had any of these symptoms in the past 24 hours? . New Cough .  Sore Throat  .  Shortness of Breath .  Difficulty Breathing .  Unexplained Body Aches   X   Have you had any one of these symptoms in the past 24 hours not related to allergies?   . Runny Nose .  Nasal Congestion .  Sneezing   X   If you have had runny nose, nasal congestion, sneezing in the past 24 hours, has it worsened?  X   EXPOSURES - check yes or no X   Have you traveled outside the state in the past 14 days?  X   Have you been in contact with someone with a confirmed diagnosis of COVID-19 or PUI in the past 14 days without wearing appropriate PPE?  X   Have you been living in the same home as a person with confirmed diagnosis of COVID-19 or a PUI (household contact)?    X   Have you been diagnosed with COVID-19?    X              What to do next: Answered NO to all: Answered YES to anything:   Proceed with unit schedule Follow the [facility] Inpatient Flowsheet.

## 2018-08-23 NOTE — Progress Notes (Signed)
[place] NOVEL CORONAVIRUS (COVID-19) DAILY CHECK-OFF SYMPTOMS - answer yes or no to each - every day NO YES  Have you had a fever in the past 24 hours?  . Fever (Temp > 37.80C / 100F) X   Have you had any of these symptoms in the past 24 hours? . New Cough .  Sore Throat  .  Shortness of Breath .  Difficulty Breathing .  Unexplained Body Aches   X   Have you had any one of these symptoms in the past 24 hours not related to allergies?   . Runny Nose .  Nasal Congestion .  Sneezing   X   If you have had runny nose, nasal congestion, sneezing in the past 24 hours, has it worsened?  X   EXPOSURES - check yes or no X   Have you traveled outside the state in the past 14 days?  X   Have you been in contact with someone with a confirmed diagnosis of COVID-19 or PUI in the past 14 days without wearing appropriate PPE?  X   Have you been living in the same home as a person with confirmed diagnosis of COVID-19 or a PUI (household contact)?    X   Have you been diagnosed with COVID-19?    X              What to do next: Answered NO to all: Answered YES to anything:   Proceed with unit schedule Follow the [facility] Inpatient Flowsheet.   

## 2018-08-23 NOTE — Progress Notes (Signed)
Adult Psychoeducational Group Note  Date:  08/23/2018 Time:  9:43 PM  Group Topic/Focus:  Wrap-Up Group:   The focus of this group is to help patients review their daily goal of treatment and discuss progress on daily workbooks.  Participation Level:  Active  Participation Quality:  Appropriate  Affect:  Appropriate  Cognitive:  Appropriate  Insight: Appropriate  Engagement in Group:  Engaged  Modes of Intervention:  Discussion  Additional Comments:  Patient attended group and said that his day was a 6. His coping skill for today were sleeping and talking to his kids.  Clarann Helvey W Nikyla Navedo 02/27/5619, 9:43 PM
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
## 2018-08-23 NOTE — [facility] Suicide Risk Assessment (Signed)
[facility] INPATIENT:  Family/Significant Other Suicide Prevention Education  Suicide Prevention Education:  Patient Refusal for Family/Significant Other Suicide Prevention Education: The patient Mario Coffey has refused to provide written consent for family/significant other to be provided Family/Significant Other Suicide Prevention Education during admission and/or prior to discharge.  Physician notified.  SPE completed with patient, as patient refused to consent to family contact. SPI pamphlet provided to pt and pt was encouraged to share information with support network, ask questions, and talk about any concerns relating to SPE. Patient denies access to guns/firearms and verbalized understanding of information provided. Mobile Crisis information also provided to patient.    Marylee Floras 08/23/2018, 10:10 AM

## 2018-08-23 NOTE — Progress Notes (Signed)
Patient ID: Mario Coffey, male   DOB: Apr 27, 1990, 28 y.o.   MRN: [id number] D) Pt has been appropriate and cooperative on approach. Eye contact fair. Affect constricted. Pt guarded and superficial. Requires prompting to attend unit groups and activities. Pt rates his depression a 4/10 with 10 being the worst. Denies s.i. pt rates his appetite, sleep, and energy level as "good". Pt was observed to be angry and verbally aggressive on the phone but is pleasant to staff. No physical c/o. A) level 3 obs for safety. Support and encouragement provided. Med ed reinforced. R) Superficially cooperative.

## 2018-08-23 NOTE — Tx Team (Signed)
Interdisciplinary Treatment and Diagnostic Plan Update  08/23/2018 Time of Session: 9:00am Mario Coffey MRN: [id number]  Principal Diagnosis: <principal problem not specified>  Secondary Diagnoses: Active Problems:   MDD (major depressive disorder)   Current Medications:  Current Facility-Administered Medications  Medication Dose Route Frequency Provider Last Rate Last Dose  . acetaminophen (TYLENOL) tablet 650 mg  650 mg Oral Q6H PRN Mordecai Maes, NP      . alum & mag hydroxide-simeth (MAALOX/MYLANTA) 200-200-20 MG/5ML suspension 30 mL  30 mL Oral Q4H PRN Mordecai Maes, NP      . hydrOXYzine (ATARAX/VISTARIL) tablet 25 mg  25 mg Oral Q6H PRN Mordecai Maes, NP   25 mg at 08/22/18 2132  . loperamide (IMODIUM) capsule 2-4 mg  2-4 mg Oral PRN Mordecai Maes, NP      . LORazepam (ATIVAN) tablet 1 mg  1 mg Oral Q6H PRN Mordecai Maes, NP   1 mg at 08/22/18 2132  . multivitamin with minerals tablet 1 tablet  1 tablet Oral Daily Mordecai Maes, NP   1 tablet at 08/23/18 [phone]  . nicotine (NICODERM CQ - dosed in mg/24 hours) patch 21 mg  21 mg Transdermal Daily Cobos, Myer Peer, MD   21 mg at 08/23/18 0807  . ondansetron (ZOFRAN-ODT) disintegrating tablet 4 mg  4 mg Oral Q6H PRN Mordecai Maes, NP      . sertraline (ZOLOFT) tablet 50 mg  50 mg Oral Daily Cobos, Myer Peer, MD   50 mg at 08/23/18 0808  . thiamine (VITAMIN B-1) tablet 100 mg  100 mg Oral Daily Mordecai Maes, NP   100 mg at 08/23/18 0808   PTA Medications: No medications prior to admission.    Patient Stressors:    Patient Strengths:    Treatment Modalities: Medication Management, Group therapy, Case management,  1 to 1 session with clinician, Psychoeducation, Recreational therapy.   Physician Treatment Plan for Primary Diagnosis: <principal problem not specified> Long Term Goal(s): Improvement in symptoms so as ready for discharge Improvement in symptoms so as ready for discharge   Short Term  Goals: Ability to identify changes in lifestyle to reduce recurrence of condition will improve Ability to verbalize feelings will improve Ability to disclose and discuss suicidal ideas Ability to demonstrate self-control will improve Ability to identify and develop effective coping behaviors will improve Compliance with prescribed medications will improve Ability to identify changes in lifestyle to reduce recurrence of condition will improve Ability to verbalize feelings will improve Ability to disclose and discuss suicidal ideas Ability to demonstrate self-control will improve Ability to identify and develop effective coping behaviors will improve Ability to maintain clinical measurements within normal limits will improve  Medication Management: Evaluate patient's response, side effects, and tolerance of medication regimen.  Therapeutic Interventions: 1 to 1 sessions, Unit Group sessions and Medication administration.  Evaluation of Outcomes: Progressing  Physician Treatment Plan for Secondary Diagnosis: Active Problems:   MDD (major depressive disorder)  Long Term Goal(s): Improvement in symptoms so as ready for discharge Improvement in symptoms so as ready for discharge   Short Term Goals: Ability to identify changes in lifestyle to reduce recurrence of condition will improve Ability to verbalize feelings will improve Ability to disclose and discuss suicidal ideas Ability to demonstrate self-control will improve Ability to identify and develop effective coping behaviors will improve Compliance with prescribed medications will improve Ability to identify changes in lifestyle to reduce recurrence of condition will improve Ability to verbalize feelings will improve Ability to disclose  and discuss suicidal ideas Ability to demonstrate self-control will improve Ability to identify and develop effective coping behaviors will improve Ability to maintain clinical measurements within  normal limits will improve     Medication Management: Evaluate patient's response, side effects, and tolerance of medication regimen.  Therapeutic Interventions: 1 to 1 sessions, Unit Group sessions and Medication administration.  Evaluation of Outcomes: Progressing   RN Treatment Plan for Primary Diagnosis: <principal problem not specified> Long Term Goal(s): Knowledge of disease and therapeutic regimen to maintain health will improve  Short Term Goals: Ability to remain free from injury will improve, Ability to verbalize feelings will improve, Ability to identify and develop effective coping behaviors will improve and Compliance with prescribed medications will improve  Medication Management: RN will administer medications as ordered by provider, will assess and evaluate patient's response and provide education to patient for prescribed medication. RN will report any adverse and/or side effects to prescribing provider.  Therapeutic Interventions: 1 on 1 counseling sessions, Psychoeducation, Medication administration, Evaluate responses to treatment, Monitor vital signs and CBGs as ordered, Perform/monitor CIWA, COWS, AIMS and Fall Risk screenings as ordered, Perform wound care treatments as ordered.  Evaluation of Outcomes: Progressing   LCSW Treatment Plan for Primary Diagnosis: <principal problem not specified> Long Term Goal(s): Safe transition to appropriate next level of care at discharge, Engage patient in therapeutic group addressing interpersonal concerns.  Short Term Goals: Engage patient in aftercare planning with referrals and resources, Increase social support, Increase emotional regulation, Identify triggers associated with mental health/substance abuse issues and Increase skills for wellness and recovery  Therapeutic Interventions: Assess for all discharge needs, 1 to 1 time with Social worker, Explore available resources and support systems, Assess for adequacy in community  support network, Educate family and significant other(s) on suicide prevention, Complete Psychosocial Assessment, Interpersonal group therapy.  Evaluation of Outcomes: Progressing   Progress in Treatment: Attending groups: Yes. Participating in groups: Yes. Taking medication as prescribed: Yes. Toleration medication: Yes. Family/Significant other contact made: No, will contact:  declines consents, SPE reviewed with patient. Patient understands diagnosis: Yes. Discussing patient identified problems/goals with staff: Yes. Medical problems stabilized or resolved: Yes. Denies suicidal/homicidal ideation: Yes. Issues/concerns per patient self-inventory: Yes.  New problem(s) identified: Yes, Describe:  No insurance, pending legal issues  New Short Term/Long Term Goal(s): detox, medication management for mood stabilization; elimination of SI thoughts; development of comprehensive mental wellness/sobriety plan.  Patient Goals: Get back on medications  Discharge Plan or Barriers: Plans to stay with his father in [place], would like outpatient follow up. [facility].  Reason for Continuation of Hospitalization: Anxiety Depression Withdrawal symptoms  Estimated Length of Stay: 1-3 days  Attendees: Patient: Mario Coffey 08/23/2018 10:25 AM  Physician: Javier Dockerr.Cobos 08/23/2018 10:25 AM  Nursing: Marcelino DusterMichelle, LPN 4/0/98118/05/2018 [id number]:25 AM  RN Care Manager: 08/23/2018 10:25 AM  Social Worker: Enid Cutterharlotte Artesha Wemhoff, LCSWA 08/23/2018 10:25 AM  Recreational Therapist:  08/23/2018 10:25 AM  Other:  08/23/2018 10:25 AM  Other:  08/23/2018 10:25 AM  Other: 08/23/2018 10:25 AM    Scribe for Treatment Team: Darreld Mcleanharlotte C Carrissa Taitano, LCSWA 08/23/2018 10:25 AM

## 2018-08-24 MED ORDER — TRAZODONE HCL 50 MG PO TABS
25.0000 mg | ORAL_TABLET | Freq: Every evening | ORAL | Status: DC | PRN
  Filled 2018-08-24: qty 1

## 2018-08-24 NOTE — Plan of Care (Signed)
Progress note  D: pt found in bed; compliant with medication administration. Pt denies any physical complaints or pain. Pt is minimal but pleasant. Pt states he and his girlfriend are progressing forward. Pt has been reclusive to his room most of the day. Pt denies si/hi/ah/vh and verbally agrees to approach staff if these become apparent or before harming himself/others while at [facility]: Pt provided support and encouragement. Pt given medication per protocol and standing orders. Q79m safety checks implemented and continued.  R: Pt safe on the unit. Will continue to monitor.  Pt progressing in the following metrics  Problem: Education: Goal: Knowledge of [facility] Education information/materials will improve Outcome: Progressing Goal: Emotional status will improve Outcome: Progressing Goal: Mental status will improve Outcome: Progressing Goal: Verbalization of understanding the information provided will improve Outcome: Progressing

## 2018-08-24 NOTE — Progress Notes (Addendum)
[facility]  08/24/2018 3:05 PM Sarina SerZachery Dylan Puder  MRN:  [id number] Subjective:  "I'm good."  Mr. Louthan found sitting in the dayroom. He appears euthymic, reports good mood, and denies withdrawal symptoms. He states he had not had any alcohol for one week prior to the day of admission. He denies SI/HI/AVH. He has been visible in the milieu, interacting appropriately and participating in groups. He has spoken on the phone with his girlfriend and reconciled. However, he reports the landlord will not allow him to return to their home related to the police call on the day of admission. He will be staying with his father until he and his girlfriend find new housing. He had done well on Zoloft in the past but discontinued due to difficulties finding transportation to the pharmacy. His father and grandfather have agreed to help with picking up prescriptions after discharge. He states intent to follow up with AA and abstain from alcohol. He reports good sleep last night but reports that trazodone made him feel "dizzy and weird." He is agreeable to try decreased dose of trazodone for sleep. He states he feels he will be ready for discharge tomorrow.  From admission H&P: Presented to ED yesterday via EMS . States " I got into an argument with my girlfriend, I was really drunk". States GF had called police, and as they arrived he impulsively cut self on forearm. Has visible linear  cut /laceration on forearm. He states that " in the heat of the moment, I was just mad, I guess, I don't know why I did it ".   Principal Problem: <principal problem not specified> Diagnosis: Active Problems:   MDD (major depressive disorder)  Total Time spent with patient: 15 minutes  Past Psychiatric History: See admission H&P  Past Medical History:  Past Medical History:  Diagnosis Date  . Anxiety   . Asthma   . Depression    History reviewed. No pertinent surgical history. Family History: History reviewed. No  pertinent family history. Family Psychiatric  History: See admission H&P Social History:  Social History   Substance and Sexual Activity  Alcohol Use Yes  . Alcohol/week: 2.0 standard drinks  . Types: 2 Cans of beer per week   Comment: daily     Social History   Substance and Sexual Activity  Drug Use Not Currently    Social History   Socioeconomic History  . Marital status: Single    Spouse name: Not on file  . Number of children: Not on file  . Years of education: Not on file  . Highest education level: Not on file  Occupational History  . Not on file  Social Needs  . Financial resource strain: Not on file  . Food insecurity    Worry: Not on file    Inability: Not on file  . Transportation needs    Medical: Not on file    Non-medical: Not on file  Tobacco Use  . Smoking status: Current Every Day Smoker    Packs/day: 1.50    Years: 5.00    Pack years: 7.50    Types: Cigarettes  . Smokeless tobacco: Never Used  Substance and Sexual Activity  . Alcohol use: Yes    Alcohol/week: 2.0 standard drinks    Types: 2 Cans of beer per week    Comment: daily  . Drug use: Not Currently  . Sexual activity: Not on file  Lifestyle  . Physical activity    Days per  week: Not on file    Minutes per session: Not on file  . Stress: Not on file  Relationships  . Social Musicianconnections    Talks on phone: Not on file    Gets together: Not on file    Attends religious service: Not on file    Active member of club or organization: Not on file    Attends meetings of clubs or organizations: Not on file    Relationship status: Not on file  Other Topics Concern  . Not on file  Social History Narrative  . Not on file   Additional Social History:    Pain Medications: see MAR Prescriptions: see MAR Over the Counter: see MAR History of alcohol / drug use?: Yes Longest period of sobriety (when/how long): unsure Negative Consequences of Use: Financial, Personal  relationships Withdrawal Symptoms: Irritability Name of Substance 1: alcohol 1 - Age of First Use: 28 yrs old 1 - Amount (size/oz): one and half pack beer daily 1 - Frequency: daily 1 - Duration: starting drinking alcohol at age 28 yrs old 1 - Last Use / Amount: 08/21/2018 Name of Substance 2: cigarettes 2 - Age of First Use: 28 yrs old 2 - Amount (size/oz): 1.5 packs daily cigarettes 2 - Frequency: daily 2 - Duration: sinceage of 28 yrs old, started smoking cigarettes 2 - Last Use / Amount: 08/22/2018                Sleep: Good  Appetite:  Good  Current Medications: Current Facility-Administered Medications  Medication Dose Route Frequency Provider Last Rate Last Dose  . acetaminophen (TYLENOL) tablet 650 mg  650 mg Oral Q6H PRN Denzil Magnusonhomas, Lashunda, NP      . alum & mag hydroxide-simeth (MAALOX/MYLANTA) 200-200-20 MG/5ML suspension 30 mL  30 mL Oral Q4H PRN Denzil Magnusonhomas, Lashunda, NP      . hydrOXYzine (ATARAX/VISTARIL) tablet 25 mg  25 mg Oral Q6H PRN Denzil Magnusonhomas, Lashunda, NP   25 mg at 08/23/18 2129  . loperamide (IMODIUM) capsule 2-4 mg  2-4 mg Oral PRN Denzil Magnusonhomas, Lashunda, NP      . LORazepam (ATIVAN) tablet 1 mg  1 mg Oral Q6H PRN Denzil Magnusonhomas, Lashunda, NP   1 mg at 08/22/18 2132  . multivitamin with minerals tablet 1 tablet  1 tablet Oral Daily Denzil Magnusonhomas, Lashunda, NP   1 tablet at 08/24/18 0734  . nicotine (NICODERM CQ - dosed in mg/24 hours) patch 21 mg  21 mg Transdermal Daily , Rockey SituFernando A, MD   21 mg at 08/24/18 0734  . ondansetron (ZOFRAN-ODT) disintegrating tablet 4 mg  4 mg Oral Q6H PRN Denzil Magnusonhomas, Lashunda, NP      . sertraline (ZOLOFT) tablet 50 mg  50 mg Oral Daily , Rockey SituFernando A, MD   50 mg at 08/24/18 0734  . thiamine (VITAMIN B-1) tablet 100 mg  100 mg Oral Daily Denzil Magnusonhomas, Lashunda, NP   100 mg at 08/24/18 0734  . traZODone (DESYREL) tablet 50 mg  50 mg Oral QHS PRN Jearld Leschixon, Rashaun M, NP   50 mg at 08/23/18 2129    Lab Results:  Results for orders placed or performed during the  hospital encounter of 08/22/18 (from the past 48 hour(s))  Hemoglobin A1c     Status: None   Collection Time: 08/23/18  6:29 AM  Result Value Ref Range   Hgb A1c MFr Bld 5.3 4.8 - 5.6 %    Comment: (NOTE) Pre diabetes:          5.7%-6.4%  Diabetes:              >6.4% Glycemic control for   <7.0% adults with diabetes    Mean Plasma Glucose 105.41 mg/dL    Comment: Performed at [facility] [address] [place], [place] [address]  Lipid panel     Status: Abnormal   Collection Time: 08/23/18  6:29 AM  Result Value Ref Range   Cholesterol 220 (H) 0 - 200 mg/dL   Triglycerides 235 (H) <150 mg/dL   HDL 34 (L) >40 mg/dL   Total CHOL/HDL Ratio 6.5 RATIO   VLDL 47 (H) 0 - 40 mg/dL   LDL Cholesterol 139 (H) 0 - 99 mg/dL    Comment:        Total Cholesterol/HDL:CHD Risk Coronary Heart Disease Risk Table                     Men   Women  1/2 Average Risk   3.4   3.3  Average Risk       5.0   4.4  2 X Average Risk   9.6   7.1  3 X Average Risk  23.4   11.0        Use the calculated Patient Ratio above and the CHD Risk Table to determine the patient's CHD Risk.        ATP III CLASSIFICATION (LDL):  <100     mg/dL   Optimal  100-129  mg/dL   Near or Above                    Optimal  130-159  mg/dL   Borderline  160-189  mg/dL   High  >190     mg/dL   Very High Performed at [facility] [address] [place], [place] [address]   TSH     Status: None   Collection Time: 08/23/18  6:29 AM  Result Value Ref Range   TSH 2.000 0.350 - 4.500 uIU/mL    Comment: Performed by a 3rd Generation assay with a functional sensitivity of <=0.01 uIU/mL. Performed at [facility], [place] [address] [place],  [address]     Blood Alcohol level:  Lab Results  Component Value Date   ETH 158 (H) 08/21/2018   ETH 201 (H) 44/01/270    Metabolic Disorder Labs: Lab Results  Component Value Date   HGBA1C 5.3 08/23/2018   MPG 105.41 08/23/2018    No results found for: PROLACTIN Lab Results  Component Value Date   CHOL 220 (H) 08/23/2018   TRIG 235 (H) 08/23/2018   HDL 34 (L) 08/23/2018   CHOLHDL 6.5 08/23/2018   VLDL 47 (H) 08/23/2018   LDLCALC 139 (H) 08/23/2018    Physical Findings: AIMS: Facial and Oral Movements Muscles of Facial Expression: None, normal Lips and Perioral Area: None, normal Jaw: None, normal Tongue: None, normal,Extremity Movements Upper (arms, wrists, hands, fingers): None, normal Lower (legs, knees, ankles, toes): None, normal, Trunk Movements Neck, shoulders, hips: None, normal, Overall Severity Severity of abnormal movements (highest score from questions above): None, normal Incapacitation due to abnormal movements: None, normal Patient's awareness of abnormal movements (rate only patient's report): No Awareness, Dental Status Current problems with teeth and/or dentures?: No Does patient usually wear dentures?: No  CIWA:  CIWA-Ar Total: 1 COWS:  COWS Total Score: 3  Musculoskeletal: Strength & Muscle Tone: within normal limits Gait & Station: normal Patient leans: N/A  Psychiatric Specialty Exam: Physical Exam  Nursing note and vitals reviewed. Constitutional: He is oriented to person, place, and time. He appears well-developed and well-nourished.  Cardiovascular: Normal rate.  Respiratory: Effort normal.  Neurological: He is alert and oriented to person, place, and time.    Review of Systems  Constitutional: Negative.   Psychiatric/Behavioral: Positive for depression (stable on medication) and substance abuse (ETOH). Negative for hallucinations and suicidal ideas. The patient is not nervous/anxious and does not have insomnia.     Blood pressure 109/77, pulse 71, temperature 97.9 F (36.6 C), temperature source Oral, resp. rate 18, height 6' (1.829 m), weight 125.2 kg, SpO2 97 %.Body mass index is 37.43 kg/m.  General Appearance: Casual  Eye Contact:  Good  Speech:  Clear and  Coherent and Normal Rate  Volume:  Normal  Mood:  Euthymic  Affect:  Appropriate and Congruent  Thought Process:  Coherent  Orientation:  Full (Time, Place, and Person)  Thought Content:  Logical  Suicidal Thoughts:  No  Homicidal Thoughts:  No  Memory:  Immediate;   Good Recent;   Good Remote;   Good  Judgement:  Intact  Insight:  Fair  Psychomotor Activity:  Normal  Concentration:  Concentration: Good and Attention Span: Good  Recall:  Good  Fund of Knowledge:  Fair  Language:  Good  Akathisia:  No  Handed:  Right  AIMS (if indicated):     Assets:  Communication Skills Desire for Improvement Housing Leisure Time Resilience Social Support  ADL's:  Intact  Cognition:  WNL  Sleep:  Number of Hours: 6.75     Treatment Plan Summary: Daily contact with patient to assess and evaluate symptoms and progress in treatment and Medication management   Continue inpatient hospitalization.  Continue Zoloft 50 mg PO daily for mood Continue Ativan 1 mg PO Q6HR PRN CIWA>10 Decrease trazodone to 25 mg PO QHS PRN insomnia Continue Vistaril 25 mg PO Q6HR PRN anxiety  Patient will participate in the therapeutic group milieu.  Discharge disposition in progress.   Aldean BakerJanet E Sykes, NP 08/24/2018, 3:05 PM   Attest to NP Progress Note

## 2018-08-25 DIAGNOSIS — F1024 Alcohol dependence with alcohol-induced mood disorder: Secondary | ICD-10-CM

## 2018-08-25 MED ORDER — HYDROXYZINE HCL 25 MG PO TABS: 25.0000 mg | ORAL_TABLET | Freq: Four times a day (QID) | ORAL | 0 refills | Status: AC | PRN

## 2018-08-25 MED ORDER — NICOTINE 21 MG/24HR TD PT24: 21.0000 mg | MEDICATED_PATCH | Freq: Every day | TRANSDERMAL | 0 refills | Status: AC

## 2018-08-25 MED ORDER — SERTRALINE HCL 50 MG PO TABS: 50.0000 mg | ORAL_TABLET | Freq: Every day | ORAL | 0 refills | Status: AC

## 2018-08-25 NOTE — Progress Notes (Signed)
Patient ID: Mario Coffey, male   DOB: 02/06/1990, 28 y.o.   MRN: [id number]  Discharge Note  Patient denies SI/HI and states readiness for discharge.  Written and verbal discharge instructions reviewed with the patient. Patient accepting to information and verbalized understanding with no concerns. All belongings returned to patient from the unit and secured lockers.  Patient was safely escorted to the lobby for discharge.

## 2018-08-25 NOTE — Progress Notes (Signed)
  [facility] Adult Case Management Discharge Plan :  Will you be returning to the same living situation after discharge:  No. Going to dad's house. At discharge, do you have transportation home?: Yes,  step-mom picking up Do you have the ability to pay for your medications: No. Referred to [facility]  Release of information consent forms completed and in the chart. [place] meeting list on chart.   Patient to Follow up at: Follow-up Information    [facility]. Go on 08/29/2018.   Why: Your hospital discharge appointment will be held by phone on 08/29/2018 at 8:30. Please complete paperwork in office prior to your appointment. Please bring hospital discharge paperwork, photo ID, and proof of income or insurance.  Contact information: [address] [place] [address] [phone]           Next level of care provider has access to [facility] and Suicide Prevention discussed: Yes,  with patient.  Have you used any form of tobacco in the last 30 days? (Cigarettes, Smokeless Tobacco, Cigars, and/or Pipes): Yes  Has patient been referred to the Quitline?: Patient refused referral  Patient has been referred for addiction treatment: Yes  Joellen Jersey, [place] 08/25/2018, 10:16 AM

## 2018-08-25 NOTE — [facility] Suicide Risk Assessment (Addendum)
[facility] Discharge Suicide Risk Assessment   Principal Problem:self injurious behaviors Discharge Diagnoses: Active Problems:   MDD (major depressive disorder)   Total Time spent with patient: 30 minutes  Musculoskeletal: Strength & Muscle Tone: within normal limits Gait & Station: normal Patient leans: N/A  Psychiatric Specialty Exam: ROS no cough, no shortness of breath, no fever or chills  Blood pressure 119/75, pulse 72, temperature 98.7 F (37.1 C), temperature source Oral, resp. rate 18, height 6' (1.829 m), weight 125.2 kg, SpO2 97 %.Body mass index is 37.43 kg/m.  General Appearance: improving grooming  Eye Contact::  Good  Speech:  Normal Rate409  Volume:  Normal  Mood:  improved, states " I feel a lot better"  Affect:  Appropriate and fuller in range   Thought Process:  Linear and Descriptions of Associations: Intact  Orientation:  Full (Time, Place, and Person)  Thought Content:  no hallucinations, no delusions   Suicidal Thoughts:  No, denies suicidal or self injurious ideations, no violent or homicidal ideations  Homicidal Thoughts:  No  Memory:  recent and remote grossly intact   Judgement:  Other:  improving  Insight:  improving   Psychomotor Activity:  Normal- no symptoms of withdrawal at this time, no tremors, no diaphoresis, no restlessness, vitals stable  Concentration:  Good  Recall:  Good  Fund of Knowledge:Good  Language: Good  Akathisia:  Negative  Handed:  Right  AIMS (if indicated):     Assets:  Communication Skills Desire for Improvement Resilience  Sleep:  Number of Hours: 6.75  Cognition: WNL  ADL's:  Intact   Mental Status Per Nursing Assessment::   On Admission:  Self-harm thoughts, Self-harm behaviors, Suicidal ideation indicated by patient  Demographic Factors:  28, single, three children, was living with her GF prior to admission, but states he is going to go live with his father for now.   Loss Factors: Alcohol abuse, relationship  stressors  Historical Factors: One prior psychiatric admission in January 2020 for alcohol intoxication and suicidal ideations  Risk Reduction Factors:   Responsible for children under 28 years of age, Sense of responsibility to family, Living with another person, especially a relative and Positive coping skills or problem solving skills  Continued Clinical Symptoms:  At this time reports feeling " a lot better" than on admission . Presents with improving mood and reactive/appropriate affect. No thought disorder, no suicidal or self injurious ideations, no homicidal or violent ideations, no psychotic symptoms, future oriented .  Denies medication side effects, tolerating Zoloft well, side effects discussed, including potential risk of sexual dysfunction Behavior on unit calm and in good control, pleasant on approach.  Cognitive Features That Contribute To Risk:  No gross cognitive deficits noted upon discharge. Is alert , attentive, and oriented x 3   Suicide Risk:  Mild:  Suicidal ideation of limited frequency, intensity, duration, and specificity.  There are no identifiable plans, no associated intent, mild dysphoria and related symptoms, good self-control (both objective and subjective assessment), few other risk factors, and identifiable protective factors, including available and accessible social support.  Follow-up Information    Services, [facility]. Go on 08/29/2018.   Why: Your hospital discharge appointment will be held by phone on 08/29/2018 at 8:30. Please complete paperwork in office prior to your appointment. Please bring hospital discharge paperwork, photo ID, and proof of income or insurance.  Contact information: [address] [place] [address] [phone]           Plan  Of Care/Follow-up recommendations:  Activity:  as tolerated  Diet:  regular Tests:  NA Other:  See below  Patient expresses readiness for discharge . Leaving unit in good spirits. Plans to  go live with father, as above Plans to follow up as above.  Craige CottaFernando A Cobos, MD 08/25/2018, 9:10 AM

## 2018-08-25 NOTE — Discharge Summary (Addendum)
Physician Discharge Summary Note  Patient:  Mario Coffey is an 28 y.o., male MRN:  [id number] DOB:  Jul 05, 1990 Patient phone:  [phone] (home)  Patient address:   [address] [place] [address],  Total Time spent with patient: 15 minutes  Date of Admission:  08/22/2018 Date of Discharge: 08/25/18  Reason for Admission:  Self-laceration while intoxicated with alcohol  Principal Problem: <principal problem not specified> Discharge Diagnoses: Active Problems:   MDD (major depressive disorder)   Past Psychiatric History: One prior psychiatric admission in January 2020. At the time was admitted due to alcohol intoxication, suicidal ideation of cutting throat /self cutting, which he states was related to argument with SO and intoxication. At the time was discharged on Zoloft .  Denies history of severe depression, but does endorse history of worsening depression at times  when he drinks , denies history of mania or hypomania,denies history of PTSD.  Denies history of psychosis.  Past Medical History:  Past Medical History:  Diagnosis Date  . Anxiety   . Asthma   . Depression    History reviewed. No pertinent surgical history. Family History: History reviewed. No pertinent family history. Family Psychiatric  History: no history of mental illness or of suicides in family. Mother and grandfather have history of alcohol use disorder Social History:  Social History   Substance and Sexual Activity  Alcohol Use Yes  . Alcohol/week: 2.0 standard drinks  . Types: 2 Cans of beer per week   Comment: daily     Social History   Substance and Sexual Activity  Drug Use Not Currently    Social History   Socioeconomic History  . Marital status: Single    Spouse name: Not on file  . Number of children: Not on file  . Years of education: Not on file  . Highest education level: Not on file  Occupational History  . Not on file  Social Needs  . Financial resource strain:  Not on file  . Food insecurity    Worry: Not on file    Inability: Not on file  . Transportation needs    Medical: Not on file    Non-medical: Not on file  Tobacco Use  . Smoking status: Current Every Day Smoker    Packs/day: 1.50    Years: 5.00    Pack years: 7.50    Types: Cigarettes  . Smokeless tobacco: Never Used  Substance and Sexual Activity  . Alcohol use: Yes    Alcohol/week: 2.0 standard drinks    Types: 2 Cans of beer per week    Comment: daily  . Drug use: Not Currently  . Sexual activity: Not on file  Lifestyle  . Physical activity    Days per week: Not on file    Minutes per session: Not on file  . Stress: Not on file  Relationships  . Social Herbalist on phone: Not on file    Gets together: Not on file    Attends religious service: Not on file    Active member of club or organization: Not on file    Attends meetings of clubs or organizations: Not on file    Relationship status: Not on file  Other Topics Concern  . Not on file  Social History Narrative  . Not on file    Hospital Course:  From admission H&P: 28 year old male. Presented to ED yesterday via EMS . States " I got into  an argument with my girlfriend, I was really drunk". States GF had called police, and as they arrived he impulsively cut self on forearm. Has visible linear  cut /laceration on forearm. He states that " in the heat of the moment, I was just mad, I guess, I don't know why I did it ". He also reportedly asked police to shoot him. At this time denies SI and states " I didn't really want to die, I have my kids to think of ". He reports he knows alcohol intoxication played a role in impulsive self injurious behavior. He had a prior admission for similar presentation/intoxication in January 2020. States that prior to above events " I was doing OK, things were all right", and denies having felt particularly depressed recently. Denies having had any suicidal ideations prior to above  incident. Endorses some neuro-vegetative symptoms, mainly hypersomnia, but denies anhedonia, persistent sadness or any SI prior to incident. He endorses history of alcohol use disorder, but states he has been drinking less than before- used to drink daily, but states has drank 3 times over last 2 weeks. Currently presents calm, comfortable and in no acute distress,he is not presenting with symptoms of WDL. Admission BAL 158.  [name] was admitted after self-inflicted injury while intoxicated with alcohol. [facility]. He was started on CIWA protocol with Ativan PRN CIWA>10. Zoloft was started. He participated in group therapy on the unit. He responded well to treatment with no adverse effects reported. He showed improved mood, affect, sleep, and interaction. On day of discharge, he presents with euthymic affect and reports stable mood. He is future-oriented, with plans to stay with his father and looking forward to seeing his children. He denies any SI/HI/AVH and contracts for safety.  He is discharging on the medications listed below. [facility] He is provided with prescriptions for medications upon discharge. His stepmother is picking him up for discharge home.  Physical Findings: AIMS: Facial and Oral Movements Muscles of Facial Expression: None, normal Lips and Perioral Area: None, normal Jaw: None, normal Tongue: None, normal,Extremity Movements Upper (arms, wrists, hands, fingers): None, normal Lower (legs, knees, ankles, toes): None, normal, Trunk Movements Neck, shoulders, hips: None, normal, Overall Severity Severity of abnormal movements (highest score from questions above): None, normal Incapacitation due to abnormal movements: None, normal Patient's awareness of abnormal movements (rate only patient's report): No Awareness, Dental Status Current problems with teeth and/or dentures?: No Does patient usually wear  dentures?: No  CIWA:  CIWA-Ar Total: 0 COWS:  COWS Total Score: 3  Musculoskeletal: Strength & Muscle Tone: within normal limits Gait & Station: normal Patient leans: N/A  Psychiatric Specialty Exam: Physical Exam  Nursing note and vitals reviewed. Constitutional: He is oriented to person, place, and time. He appears well-developed and well-nourished.  Cardiovascular: Normal rate.  Respiratory: Effort normal.  Neurological: He is alert and oriented to person, place, and time.    Review of Systems  Constitutional: Negative.   Respiratory: Negative for cough and shortness of breath.   Cardiovascular: Negative for chest pain.  Gastrointestinal: Negative for nausea and vomiting.  Neurological: Negative for headaches.  Psychiatric/Behavioral: Positive for depression (stable on medication) and substance abuse. Negative for hallucinations and suicidal ideas. The patient is not nervous/anxious and does not have insomnia.     Blood pressure 119/75, pulse 72, temperature 98.7 F (37.1 C), temperature source Oral, resp. rate 18, height 6' (  1.829 m), weight 125.2 kg, SpO2 97 %.Body mass index is 37.43 kg/m.  See MD's discharge SRA     Have you used any form of tobacco in the last 30 days? (Cigarettes, Smokeless Tobacco, Cigars, and/or Pipes): Yes  Has this patient used any form of tobacco in the last 30 days? (Cigarettes, Smokeless Tobacco, Cigars, and/or Pipes) Yes, a prescription for an FDA-approved medication for tobacco cessation was offered at discharge.   Blood Alcohol level:  Lab Results  Component Value Date   ETH 158 (H) 08/21/2018   ETH 201 (H) 01/27/2018    Metabolic Disorder Labs:  Lab Results  Component Value Date   HGBA1C 5.3 08/23/2018   MPG 105.41 08/23/2018   No results found for: PROLACTIN Lab Results  Component Value Date   CHOL 220 (H) 08/23/2018   TRIG 235 (H) 08/23/2018   HDL 34 (L) 08/23/2018   CHOLHDL 6.5 08/23/2018   VLDL 47 (H) 08/23/2018    LDLCALC 139 (H) 08/23/2018    See Psychiatric Specialty Exam and Suicide Risk Assessment completed by Attending Physician prior to discharge.  Discharge destination:  Home  Is patient on multiple antipsychotic therapies at discharge:  No   Has Patient had three or more failed trials of antipsychotic monotherapy by history:  No  Recommended Plan for Multiple Antipsychotic Therapies: NA  Discharge Instructions    Discharge instructions   Complete by: As directed    Patient is instructed to take all prescribed medications as recommended. Report any side effects or adverse reactions to your outpatient psychiatrist. Patient is instructed to abstain from alcohol and illegal drugs while on prescription medications. In the event of worsening symptoms, patient is instructed to call the crisis hotline, 911, or go to the nearest emergency department for evaluation and treatment.     Allergies as of 08/25/2018      Reactions   Codeine Nausea And Vomiting      Medication List    TAKE these medications     Indication  hydrOXYzine 25 MG tablet Commonly known as: ATARAX/VISTARIL Take 1 tablet (25 mg total) by mouth every 6 (six) hours as needed for anxiety.  Indication: Feeling Anxious   nicotine 21 mg/24hr patch Commonly known as: NICODERM CQ - dosed in mg/24 hours Place 1 patch (21 mg total) onto the skin daily.  Indication: Nicotine Addiction   sertraline 50 MG tablet Commonly known as: ZOLOFT Take 1 tablet (50 mg total) by mouth daily.  Indication: Major Depressive Disorder      Follow-up Information    [facility], [facility]. Go on 08/29/2018.   Why: Your hospital discharge appointment will be held by phone on 08/29/2018 at 8:30. Please complete paperwork in office prior to your appointment. Please bring hospital discharge paperwork, photo ID, and proof of income or insurance.  Contact information: [address] [place] [id number] [phone]           Follow-up  recommendations: Activity as tolerated. Diet as recommended by primary care physician. Keep all scheduled follow-up appointments as recommended.   Comments:   Patient is instructed to take all prescribed medications as recommended. Report any side effects or adverse reactions to your outpatient psychiatrist. Patient is instructed to abstain from alcohol and illegal drugs while on prescription medications. In the event of worsening symptoms, patient is instructed to call the crisis hotline, 911, or go to the nearest emergency department for evaluation and treatment.  Signed: Aldean BakerJanet E Sykes, NP 08/25/2018, 9:35 AM  Patient seen, Suicide Assessment Completed.  Disposition Plan Reviewed  

## 2023-03-15 ENCOUNTER — Other Ambulatory Visit: Payer: Self-pay

## 2023-03-15 ENCOUNTER — Emergency Department
Admission: EM | Admit: 2023-03-15 | Discharge: 2023-03-15 | Discharge status: Against Medical Advice | Payer: Medicaid Other | Attending: Emergency Medicine | Admitting: Emergency Medicine

## 2023-03-15 DIAGNOSIS — Z5321 Procedure and treatment not carried out due to patient leaving prior to being seen by health care provider: Secondary | ICD-10-CM | POA: Insufficient documentation

## 2023-03-15 DIAGNOSIS — R1031 Right lower quadrant pain: Secondary | ICD-10-CM | POA: Insufficient documentation

## 2023-03-15 LAB — URINALYSIS, ROUTINE W REFLEX MICROSCOPIC
Bilirubin Urine: NEGATIVE
Glucose, UA: NEGATIVE mg/dL
Hgb urine dipstick: NEGATIVE
Ketones, ur: NEGATIVE mg/dL
Leukocytes,Ua: NEGATIVE
Nitrite: NEGATIVE
Protein, ur: NEGATIVE mg/dL
Specific Gravity, Urine: 1.026 (ref 1.005–1.030)
pH: 5 (ref 5.0–8.0)

## 2023-03-15 LAB — COMPREHENSIVE METABOLIC PANEL
ALT: 28 U/L (ref 0–44)
AST: 26 U/L (ref 15–41)
Albumin: 3.9 g/dL (ref 3.5–5.0)
Alkaline Phosphatase: 48 U/L (ref 38–126)
Anion gap: 9 (ref 5–15)
BUN: 11 mg/dL (ref 6–20)
CO2: 25 mmol/L (ref 22–32)
Calcium: 8.6 mg/dL — ABNORMAL LOW (ref 8.9–10.3)
Chloride: 102 mmol/L (ref 98–111)
Creatinine, Ser: 1 mg/dL (ref 0.61–1.24)
GFR, Estimated: >60 mL/min (ref >60)
Glucose, Bld: 97 mg/dL (ref 70–99)
Potassium: 3.9 mmol/L (ref 3.5–5.1)
Sodium: 136 mmol/L (ref 135–145)
Total Bilirubin: 1 mg/dL (ref 0.0–1.2)
Total Protein: 6.5 g/dL (ref 6.5–8.1)

## 2023-03-15 LAB — CBC WITH DIFFERENTIAL/PLATELET
Abs Immature Granulocytes: 0.03 10*3/uL (ref 0.00–0.07)
Basophils Absolute: 0.1 10*3/uL (ref 0.0–0.1)
Basophils Relative: 1 %
Eosinophils Absolute: 0.4 10*3/uL (ref 0.0–0.5)
Eosinophils Relative: 4 %
HCT: 49.9 % (ref 39.0–52.0)
Hemoglobin: 17.3 g/dL — ABNORMAL HIGH (ref 13.0–17.0)
Immature Granulocytes: 0 %
Lymphocytes Relative: 31 %
Lymphs Abs: 3.1 10*3/uL (ref 0.7–4.0)
MCH: 31.9 pg (ref 26.0–34.0)
MCHC: 34.7 g/dL (ref 30.0–36.0)
MCV: 91.9 fL (ref 80.0–100.0)
Monocytes Absolute: 0.7 10*3/uL (ref 0.1–1.0)
Monocytes Relative: 7 %
Neutro Abs: 5.8 10*3/uL (ref 1.7–7.7)
Neutrophils Relative %: 57 %
Platelets: 256 10*3/uL (ref 150–400)
RBC: 5.43 MIL/uL (ref 4.22–5.81)
RDW: 12.5 % (ref 11.5–15.5)
WBC: 10 10*3/uL (ref 4.0–10.5)
nRBC: 0 % (ref 0.0–0.2)

## 2023-03-15 LAB — LIPASE, BLOOD: Lipase: 25 U/L (ref 11–51)

## 2023-03-15 NOTE — ED Notes (Signed)
 No answer when called from the lobby x3

## 2023-03-15 NOTE — ED Triage Notes (Signed)
 Patient states lower abdominal pain that is worse on the right lower that started last night; last BM this AM.
